# Patient Record
Sex: Male | Born: 1985 | Race: White | Hispanic: No | Marital: Single | Smoking: Current every day smoker
Health system: Southern US, Community
[De-identification: ages and names within clinical notes are randomized; demographics above are authoritative.]

## PROBLEM LIST (undated history)

## (undated) DIAGNOSIS — T7840XA Allergy, unspecified, initial encounter: Secondary | ICD-10-CM

## (undated) DIAGNOSIS — B029 Zoster without complications: Secondary | ICD-10-CM

## (undated) DIAGNOSIS — I10 Essential (primary) hypertension: Secondary | ICD-10-CM

## (undated) DIAGNOSIS — M549 Dorsalgia, unspecified: Secondary | ICD-10-CM

## (undated) HISTORY — PX: TONSILECTOMY/ADENOIDECTOMY WITH MYRINGOTOMY: SHX6125

## (undated) HISTORY — PX: TONSILLECTOMY: SUR1361

## (undated) HISTORY — DX: Allergy, unspecified, initial encounter: T78.40XA

---
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
## 2008-07-04 ENCOUNTER — Emergency Department (HOSPITAL_COMMUNITY): Admission: EM | Admit: 2008-07-04 | Discharge: 2008-07-04 | Payer: Self-pay | Admitting: Emergency Medicine

## 2009-06-03 ENCOUNTER — Emergency Department (HOSPITAL_COMMUNITY): Admission: EM | Admit: 2009-06-03 | Discharge: 2009-06-03 | Payer: Self-pay | Admitting: Emergency Medicine

## 2011-03-22 LAB — DIFFERENTIAL
Basophils Absolute: 0.2 10*3/uL — ABNORMAL HIGH (ref 0.0–0.1)
Basophils Relative: 2 % — ABNORMAL HIGH (ref 0–1)
Eosinophils Absolute: 0.4 10*3/uL (ref 0.0–0.7)
Eosinophils Relative: 5 % (ref 0–5)
Lymphocytes Relative: 31 % (ref 12–46)
Lymphs Abs: 2.5 10*3/uL (ref 0.7–4.0)
Monocytes Absolute: 0.8 10*3/uL (ref 0.1–1.0)
Monocytes Relative: 10 % (ref 3–12)
Neutro Abs: 4.2 10*3/uL (ref 1.7–7.7)
Neutrophils Relative %: 53 % (ref 43–77)

## 2011-03-22 LAB — POCT I-STAT, CHEM 8
BUN: 19 mg/dL (ref 6–23)
Calcium, Ion: 1.2 mmol/L (ref 1.12–1.32)
Chloride: 104 mEq/L (ref 96–112)
Creatinine, Ser: 1 mg/dL (ref 0.4–1.5)
Glucose, Bld: 90 mg/dL (ref 70–99)
HCT: 50 % (ref 39.0–52.0)
Hemoglobin: 17 g/dL (ref 13.0–17.0)
Potassium: 4.4 mEq/L (ref 3.5–5.1)
Sodium: 139 mEq/L (ref 135–145)
TCO2: 28 mmol/L (ref 0–100)

## 2011-03-22 LAB — URINALYSIS, ROUTINE W REFLEX MICROSCOPIC
Bilirubin Urine: NEGATIVE
Glucose, UA: NEGATIVE mg/dL
Hgb urine dipstick: NEGATIVE
Ketones, ur: NEGATIVE mg/dL
Nitrite: NEGATIVE
Protein, ur: NEGATIVE mg/dL
Specific Gravity, Urine: 1.023 (ref 1.005–1.030)
Urobilinogen, UA: 1 mg/dL (ref 0.0–1.0)
pH: 6.5 (ref 5.0–8.0)

## 2011-03-22 LAB — CBC
HCT: 48.9 % (ref 39.0–52.0)
Hemoglobin: 16.6 g/dL (ref 13.0–17.0)
MCHC: 34 g/dL (ref 30.0–36.0)
MCV: 91.7 fL (ref 78.0–100.0)
Platelets: 210 10*3/uL (ref 150–400)
RBC: 5.33 MIL/uL (ref 4.22–5.81)
RDW: 12.7 % (ref 11.5–15.5)
WBC: 8.1 10*3/uL (ref 4.0–10.5)

## 2011-03-22 LAB — POCT CARDIAC MARKERS
CKMB, poc: <1 ng/mL — ABNORMAL LOW (ref 1.0–8.0)
Troponin i, poc: <0.05 ng/mL (ref 0.00–0.09)

## 2013-04-20 ENCOUNTER — Ambulatory Visit: Payer: BC Managed Care – PPO

## 2013-04-20 ENCOUNTER — Ambulatory Visit (INDEPENDENT_AMBULATORY_CARE_PROVIDER_SITE_OTHER): Payer: BC Managed Care – PPO | Admitting: Emergency Medicine

## 2013-04-20 VITALS — BP 120/70 | HR 84 | Temp 98.8°F | Resp 16 | Ht 72.0 in | Wt 255.0 lb

## 2013-04-20 DIAGNOSIS — R109 Unspecified abdominal pain: Secondary | ICD-10-CM

## 2013-04-20 LAB — POCT URINALYSIS DIPSTICK
Protein, UA: NEGATIVE
Spec Grav, UA: 1.015
Urobilinogen, UA: 0.2

## 2013-04-20 LAB — POCT CBC
Granulocyte percent: 64.3 %G (ref 37–80)
MCV: 93.7 fL (ref 80–97)
MID (cbc): 0.7 (ref 0–0.9)
POC Granulocyte: 5.2 (ref 2–6.9)
POC LYMPH PERCENT: 27.6 %L (ref 10–50)
Platelet Count, POC: 264 10*3/uL (ref 142–424)
RDW, POC: 12.6 %

## 2013-04-20 LAB — POCT UA - MICROSCOPIC ONLY
Casts, Ur, LPF, POC: NEGATIVE
Yeast, UA: NEGATIVE

## 2013-04-20 MED ORDER — POLYETHYLENE GLYCOL 3350 17 GM/SCOOP PO POWD: 17.0000 g | Freq: Two times a day (BID) | ORAL | Status: DC | PRN

## 2013-04-20 NOTE — Progress Notes (Signed)
[facility] [address] [place] [address] [phone]  Date:  04/20/2013   Name:  Spencer Salas   DOB:  September 03, 1986   MRN:  [id number]  PCP:  No PCP Per Patient    Chief Complaint: Flank Pain   History of Present Illness:  Spencer Salas is a 27 y.o. very pleasant male patient who presents with the following:  Vague pain in both CVA that radiate around the sides the the flanks.  No dysuria, urgency or frequency.  No nausea or vomiting. No stool change.  No food intolerance.  Excess soft drink consumption.  Stopped smoking (2 PPD) in December.  No fever or chills, some decreased appetite.  No excess alcohol.  No injury or overuse, no antecedent illness.  No cough or coryza.  No fever or chills.  Pain present for past week. No improvement with over the counter medications or other home remedies. Denies other complaint or health concern today.   There are no active problems to display for this patient.   Past Medical History  Diagnosis Date  . Allergy     Past Surgical History  Procedure Laterality Date  . Tonsilectomy/adenoidectomy with myringotomy      History  Substance Use Topics  . Smoking status: Former Smoker    Types: Cigarettes    Quit date: 12/04/2012  . Smokeless tobacco: Not on file  . Alcohol Use: Yes     Comment: 2/month    Family History  Problem Relation Age of Onset  . Hypertension Father   . Stroke Maternal Grandfather     No Known Allergies  Medication list has been reviewed and updated.  No current outpatient prescriptions on file prior to visit.   No current facility-administered medications on file prior to visit.    Review of Systems:  As per HPI, otherwise negative.    Physical Examination: Filed Vitals:   04/20/13 1127  BP: 120/70  Pulse: 84  Temp: 98.8 F (37.1 C)  Resp: 16   Filed Vitals:   04/20/13 1127  Height: 6' (1.829 m)  Weight: 255 lb (115.667 kg)   Body mass index is 34.58 kg/(m^2). Ideal  Body Weight: Weight in (lb) to have BMI = 25: 183.9  GEN: WDWN, NAD, Non-toxic, A & O x 3 HEENT: Atraumatic, Normocephalic. Neck supple. No masses, No LAD. Ears and Nose: No external deformity. CV: RRR, No M/G/R. No JVD. No thrill. No extra heart sounds. PULM: CTA B, no wheezes, crackles, rhonchi. No retractions. No resp. distress. No accessory muscle use. ABD: S, NT, ND, +BS. No rebound. No HSM. EXTR: No c/c/e NEURO Normal gait.  PSYCH: Normally interactive. Conversant. Not depressed or anxious appearing.  Calm demeanor.    Assessment and Plan: Abdominal pain miralax   Signed,  Phillips Odor, MD   Results for orders placed in visit on 04/20/13  POCT CBC      Result Value Range   WBC 8.1  4.6 - 10.2 K/uL   Lymph, poc 2.2  0.6 - 3.4   POC LYMPH PERCENT 27.6  10 - 50 %L   MID (cbc) 0.7  0 - 0.9   POC MID % 8.1  0 - 12 %M   POC Granulocyte 5.2  2 - 6.9   Granulocyte percent 64.3  37 - 80 %G   RBC 5.16  4.69 - 6.13 M/uL   Hemoglobin 16.0  14.1 - 18.1 g/dL   HCT, POC 29.5  62.1 -  53.7 %   MCV 93.7  80 - 97 fL   MCH, POC 31.0  27 - 31.2 pg   MCHC 33.1  31.8 - 35.4 g/dL   RDW, POC 96.0     Platelet Count, POC 264  142 - 424 K/uL   MPV 9.1  0 - 99.8 fL  POCT URINALYSIS DIPSTICK      Result Value Range   Color, UA yellow     Clarity, UA clear     Glucose, UA neg     Bilirubin, UA neg     Ketones, UA neg     Spec Grav, UA 1.015     Blood, UA neg     pH, UA 7.0     Protein, UA neg     Urobilinogen, UA 0.2     Nitrite, UA neg     Leukocytes, UA Negative    POCT UA - MICROSCOPIC ONLY      Result Value Range   WBC, Ur, HPF, POC 0-1     RBC, urine, microscopic neg     Bacteria, U Microscopic neg     Mucus, UA neg     Epithelial cells, urine per micros 0-1     Crystals, Ur, HPF, POC neg     Casts, Ur, LPF, POC neg     Yeast, UA neg     [facility] reading (PRIMARY) by  Dr. Dareen Piano.  No SBO, free air.

## 2013-04-22 DIAGNOSIS — R110 Nausea: Secondary | ICD-10-CM | POA: Insufficient documentation

## 2013-04-22 DIAGNOSIS — Z79899 Other long term (current) drug therapy: Secondary | ICD-10-CM | POA: Insufficient documentation

## 2013-04-22 DIAGNOSIS — R109 Unspecified abdominal pain: Secondary | ICD-10-CM | POA: Insufficient documentation

## 2013-04-22 DIAGNOSIS — K5900 Constipation, unspecified: Secondary | ICD-10-CM | POA: Insufficient documentation

## 2013-04-22 DIAGNOSIS — Z87891 Personal history of nicotine dependence: Secondary | ICD-10-CM | POA: Insufficient documentation

## 2013-04-22 LAB — COMPREHENSIVE METABOLIC PANEL
ALT: 34 U/L (ref 0–53)
AST: 25 U/L (ref 0–37)
Albumin: 4.1 g/dL (ref 3.5–5.2)
Alkaline Phosphatase: 46 U/L (ref 39–117)
CO2: 29 mEq/L (ref 19–32)
Chloride: 101 mEq/L (ref 96–112)
GFR calc non Af Amer: 79 mL/min — ABNORMAL LOW (ref >90)
Potassium: 3.9 mEq/L (ref 3.5–5.1)
Sodium: 138 mEq/L (ref 135–145)
Total Bilirubin: 0.4 mg/dL (ref 0.3–1.2)

## 2013-04-22 LAB — CBC WITH DIFFERENTIAL/PLATELET
Basophils Absolute: 0.1 10*3/uL (ref 0.0–0.1)
HCT: 42.9 % (ref 39.0–52.0)
Lymphocytes Relative: 35 % (ref 12–46)
Monocytes Absolute: 0.8 10*3/uL (ref 0.1–1.0)
Neutro Abs: 4.9 10*3/uL (ref 1.7–7.7)
Neutrophils Relative %: 52 % (ref 43–77)
Platelets: 230 10*3/uL (ref 150–400)
RDW: 12.4 % (ref 11.5–15.5)
WBC: 9.5 10*3/uL (ref 4.0–10.5)

## 2013-04-22 NOTE — ED Notes (Addendum)
Pt c/o sharp pain in the upper left quadrant. Pt states he was diagnosed with constipation and given a script for miralax. LBM 04/22/2013. Per pt he was having no trouble with BM before diagnosis.

## 2013-04-23 ENCOUNTER — Emergency Department (HOSPITAL_COMMUNITY)
Admission: EM | Admit: 2013-04-23 | Discharge: 2013-04-23 | Disposition: A | Discharge status: Home / Self Care | Payer: BC Managed Care – PPO | Attending: Emergency Medicine | Admitting: Emergency Medicine

## 2013-04-23 ENCOUNTER — Emergency Department (HOSPITAL_COMMUNITY): Payer: BC Managed Care – PPO

## 2013-04-23 DIAGNOSIS — R109 Unspecified abdominal pain: Secondary | ICD-10-CM

## 2013-04-23 MED ORDER — IBUPROFEN 800 MG PO TABS: 800.0000 mg | ORAL_TABLET | Freq: Three times a day (TID) | ORAL | Status: DC

## 2013-04-23 MED ORDER — IOHEXOL 300 MG/ML  SOLN
50.0000 mL | Freq: Once | INTRAMUSCULAR | Status: AC | PRN
  Administered 2013-04-23: 50 mL via ORAL

## 2013-04-23 MED ORDER — ONDANSETRON HCL 4 MG PO TABS: 4.0000 mg | ORAL_TABLET | Freq: Four times a day (QID) | ORAL | Status: DC

## 2013-04-23 MED ORDER — IOHEXOL 300 MG/ML  SOLN
100.0000 mL | Freq: Once | INTRAMUSCULAR | Status: AC | PRN
  Administered 2013-04-23: 100 mL via INTRAVENOUS

## 2013-04-23 NOTE — ED Provider Notes (Signed)
History     CSN: [id number]  Arrival date & time 04/22/13  2241   First MD Initiated Contact with Patient 04/23/13 0117      Chief Complaint  Patient presents with  . Abdominal Pain    (Consider location/radiation/quality/duration/timing/severity/associated sxs/prior treatment) HPI HX per PT - ABD pain x 3 days located bilateral lower quadrants pressure like and occassionaly gets some LUQ pains more sharp in quality. He was evaluated at [facility] 3 days ago for this and told that he had constipation.  Since that time taking miralax with some loose stools. No F/C. Has had some persistent nausea, no emesis. No blood in stools or urine. No h/o same. No sick contacts or recent travel. Pain mild to moderate, declines any pain medications at this time.   Past Medical History  Diagnosis Date  . Allergy     Past Surgical History  Procedure Laterality Date  . Tonsilectomy/adenoidectomy with myringotomy      Family History  Problem Relation Age of Onset  . Hypertension Father   . Stroke Maternal Grandfather     History  Substance Use Topics  . Smoking status: Former Smoker    Types: Cigarettes    Quit date: 12/04/2012  . Smokeless tobacco: Not on file  . Alcohol Use: Yes     Comment: 2/month      Review of Systems  Constitutional: Negative for fever and chills.  HENT: Negative for sore throat, neck pain and neck stiffness.   Eyes: Negative for pain.  Respiratory: Negative for shortness of breath.   Cardiovascular: Negative for chest pain.  Gastrointestinal: Positive for nausea and abdominal pain.  Genitourinary: Negative for hematuria and flank pain.  Musculoskeletal: Negative for back pain.  Skin: Negative for rash.  Neurological: Negative for headaches.  All other systems reviewed and are negative.    Allergies  Review of patient's allergies indicates no known allergies.  Home Medications   Current Outpatient Rx  Name  Route  Sig  Dispense  Refill   . cetirizine (ZYRTEC) 10 MG tablet   Oral   Take 10 mg by mouth daily.         Marland Kitchen esomeprazole (NEXIUM) 20 MG capsule   Oral   Take 20 mg by mouth as needed.         . polyethylene glycol powder (GLYCOLAX/MIRALAX) powder   Oral   Take 17 g by mouth 2 (two) times daily as needed.   3350 g   1     BP 138/92  Pulse 81  Temp(Src) 99 F (37.2 C) (Oral)  SpO2 99%  Physical Exam  Constitutional: He is oriented to person, place, and time. He appears well-developed and well-nourished.  HENT:  Head: Normocephalic and atraumatic.  Eyes: EOM are normal. Pupils are equal, round, and reactive to light.  Neck: Neck supple.  Cardiovascular: Regular rhythm and intact distal pulses.   Pulmonary/Chest: Effort normal. No respiratory distress.  Abdominal: Soft. Bowel sounds are normal. He exhibits no distension and no mass. There is no rebound and no guarding.  Mild diffuse tenderness, no acute ABD  Musculoskeletal: Normal range of motion. He exhibits no edema.  Neurological: He is alert and oriented to person, place, and time.  Skin: Skin is warm and dry.    ED Course  Procedures (including critical care time)  Results for orders placed during the hospital encounter of 04/23/13  CBC WITH DIFFERENTIAL      Result Value Range   WBC  9.5  4.0 - 10.5 K/uL   RBC 4.96  4.22 - 5.81 MIL/uL   Hemoglobin 15.5  13.0 - 17.0 g/dL   HCT 29.5  62.1 - 30.8 %   MCV 86.5  78.0 - 100.0 fL   MCH 31.3  26.0 - 34.0 pg   MCHC 36.1 (*) 30.0 - 36.0 g/dL   RDW 65.7  84.6 - 96.2 %   Platelets 230  150 - 400 K/uL   Neutrophils Relative 52  43 - 77 %   Neutro Abs 4.9  1.7 - 7.7 K/uL   Lymphocytes Relative 35  12 - 46 %   Lymphs Abs 3.3  0.7 - 4.0 K/uL   Monocytes Relative 9  3 - 12 %   Monocytes Absolute 0.8  0.1 - 1.0 K/uL   Eosinophils Relative 5  0 - 5 %   Eosinophils Absolute 0.4  0.0 - 0.7 K/uL   Basophils Relative 1  0 - 1 %   Basophils Absolute 0.1  0.0 - 0.1 K/uL  COMPREHENSIVE METABOLIC  PANEL      Result Value Range   Sodium 138  135 - 145 mEq/L   Potassium 3.9  3.5 - 5.1 mEq/L   Chloride 101  96 - 112 mEq/L   CO2 29  19 - 32 mEq/L   Glucose, Bld 88  70 - 99 mg/dL   BUN 16  6 - 23 mg/dL   Creatinine, Ser 9.52  0.50 - 1.35 mg/dL   Calcium 9.8  8.4 - 84.1 mg/dL   Total Protein 7.4  6.0 - 8.3 g/dL   Albumin 4.1  3.5 - 5.2 g/dL   AST 25  0 - 37 U/L   ALT 34  0 - 53 U/L   Alkaline Phosphatase 46  39 - 117 U/L   Total Bilirubin 0.4  0.3 - 1.2 mg/dL   GFR calc non Af Amer 79 (*) >90 mL/min   GFR calc Af Amer >90  >90 mL/min  LIPASE, BLOOD      Result Value Range   Lipase 19  11 - 59 U/L   Ct Abdomen Pelvis W Contrast  04/23/2013  *RADIOLOGY REPORT*  Clinical Data: Abdominal pain  CT ABDOMEN AND PELVIS WITH CONTRAST  Technique:  Multidetector CT imaging of the abdomen and pelvis was performed following the standard protocol during bolus administration of intravenous contrast.  Contrast: OMNIPAQUE IOHEXOL 300 MG/ML  SOLN  Comparison: None.  Findings: Limited images through the lung bases demonstrate no significant appreciable abnormality. The heart size is within normal limits. No pleural or pericardial effusion.  Low attenuation of the liver suggests fatty infiltration. Unremarkable biliary system, spleen, pancreas, adrenal glands. Nonobstructing left renal stones.  No hydronephrosis or hydroureter.  No CT evidence for colitis.  Normal appendix.  Small bowel loops are normal course and caliber.  No free intraperitoneal air or fluid.  No lymphadenopathy.  Normal caliber aorta and branch vessels.  Decompressed bladder.  Tiny fat containing left inguinal hernia.  IMPRESSION: No acute abdominopelvic process identified by CT.  Nonobstructing left renal stones.  Hepatic steatosis.   Original Report Authenticated By: Jearld Lesch, M.D.    5:08 AM recheck - pain free. No acute ABD. Plan Rx and outpatient referral provided. Ct results shared with PT. ABD pain precautions  provided   MDM  ABD pain  Labs/ CT scan  PT declines any medications in the ER  VS and nursing notes reviewed/ considered  Sunnie Nielsen, MD 04/23/13 [phone]

## 2013-04-23 NOTE — ED Notes (Signed)
CT notified pt done with contrast. 

## 2013-05-08 ENCOUNTER — Other Ambulatory Visit: Payer: Self-pay | Admitting: Nurse Practitioner

## 2013-05-08 DIAGNOSIS — R1011 Right upper quadrant pain: Secondary | ICD-10-CM

## 2013-05-08 DIAGNOSIS — M542 Cervicalgia: Secondary | ICD-10-CM

## 2013-05-11 ENCOUNTER — Ambulatory Visit
Admission: RE | Admit: 2013-05-11 | Discharge: 2013-05-11 | Disposition: A | Discharge status: Home / Self Care | Payer: BC Managed Care – PPO | Source: Ambulatory Visit | Attending: Nurse Practitioner | Admitting: Nurse Practitioner

## 2013-05-11 DIAGNOSIS — R1011 Right upper quadrant pain: Secondary | ICD-10-CM

## 2013-05-11 DIAGNOSIS — M542 Cervicalgia: Secondary | ICD-10-CM

## 2013-05-17 ENCOUNTER — Ambulatory Visit (INDEPENDENT_AMBULATORY_CARE_PROVIDER_SITE_OTHER): Payer: BC Managed Care – PPO | Admitting: Emergency Medicine

## 2013-05-17 VITALS — BP 118/90 | HR 77 | Temp 98.0°F | Resp 16 | Ht 70.0 in | Wt 245.0 lb

## 2013-05-17 DIAGNOSIS — S335XXA Sprain of ligaments of lumbar spine, initial encounter: Secondary | ICD-10-CM

## 2013-05-17 DIAGNOSIS — G5680 Other specified mononeuropathies of unspecified upper limb: Secondary | ICD-10-CM

## 2013-05-17 DIAGNOSIS — G5681 Other specified mononeuropathies of right upper limb: Secondary | ICD-10-CM

## 2013-05-17 MED ORDER — TRAMADOL HCL 50 MG PO TABS: 50.0000 mg | ORAL_TABLET | Freq: Three times a day (TID) | ORAL | Status: DC | PRN

## 2013-05-17 MED ORDER — MELOXICAM 15 MG PO TABS: 15.0000 mg | ORAL_TABLET | Freq: Every day | ORAL | Status: DC

## 2013-05-17 MED ORDER — CYCLOBENZAPRINE HCL 10 MG PO TABS: 10.0000 mg | ORAL_TABLET | Freq: Three times a day (TID) | ORAL | Status: DC | PRN

## 2013-05-17 NOTE — Progress Notes (Signed)
[facility] [address], [place] [address] [phone]  Date:  05/17/2013   Name:  Spencer Salas   DOB:  [birth year]   MRN:  [id number]  PCP:  No PCP Per Patient    Chief Complaint: Back Pain and Abdominal Pain   History of Present Illness:  Spencer Salas is a 27 y.o. very pleasant male patient who presents with the following:  Seen here, in ER, by GI and NP at work for evaluation of neck, back and abdominal pain.  So far his work up has been conflicting.  His understanding is that he has a fatty liver, inguinal hernia and cervical spinal stenosis causing his pain in the back and neck and abdomen.  He has no numbness, tingling or weakness.  Says that he frequently has radicular pain in both arms.  No nausea or vomiting. No stool change.  He was apparently referred to ortho for evaluation of his spinal stenosis.  He is unable to work due to pain in his neck and across his back at the level of the inferior scapula and in lumbar region.  No on medication.  No improvement with over the counter medications or other home remedies. Denies other complaint or health concern today.   There are no active problems to display for this patient.   Past Medical History  Diagnosis Date  . Allergy     Past Surgical History  Procedure Laterality Date  . Tonsilectomy/adenoidectomy with myringotomy      History  Substance Use Topics  . Smoking status: Current Every Day Smoker    Types: Cigarettes    Last Attempt to Quit: 12/04/2012  . Smokeless tobacco: Not on file     Comment: per patient's health survey form - other (vapor)  . Alcohol Use: Yes     Comment: 2/month    Family History  Problem Relation Age of Onset  . Hypertension Father   . Stroke Maternal Grandfather   . Gallstones Mother   . Heart disease Sister     heart mumur    No Known Allergies  Medication list has been reviewed and updated.  Current Outpatient Prescriptions on File Prior to Visit   Medication Sig Dispense Refill  . cetirizine (ZYRTEC) 10 MG tablet Take 10 mg by mouth daily as needed for allergies.       Marland Kitchen esomeprazole (NEXIUM) 20 MG capsule Take 20 mg by mouth daily as needed (for acid reflu).       Marland Kitchen ibuprofen (ADVIL,MOTRIN) 200 MG tablet Take 800 mg by mouth every 6 (six) hours as needed for pain.      Marland Kitchen ibuprofen (ADVIL,MOTRIN) 800 MG tablet Take 1 tablet (800 mg total) by mouth 3 (three) times daily.  21 tablet  0  . ondansetron (ZOFRAN) 4 MG tablet Take 1 tablet (4 mg total) by mouth every 6 (six) hours.  12 tablet  0  . polyethylene glycol powder (GLYCOLAX/MIRALAX) powder Take 17 g by mouth 2 (two) times daily as needed (for constipation).       No current facility-administered medications on file prior to visit.    Review of Systems:  As per HPI, otherwise negative.    Physical Examination: Filed Vitals:   05/17/13 1119  BP: 118/90  Pulse: 77  Temp: 98 F (36.7 C)  Resp: 16   Filed Vitals:   05/17/13 1119  Height: 5\' 10"  (1.778 m)  Weight: 245 lb (111.131 kg)   Body  mass index is 35.15 kg/(m^2). Ideal Body Weight: Weight in (lb) to have BMI = 25: 173.9  GEN: WDWN, NAD, Non-toxic, A & O x 3 HEENT: Atraumatic, Normocephalic. Neck supple. No masses, No LAD. Ears and Nose: No external deformity. CV: RRR, No M/G/R. No JVD. No thrill. No extra heart sounds. PULM: CTA B, no wheezes, crackles, rhonchi. No retractions. No resp. distress. No accessory muscle use. ABD: S, NT, ND, +BS. No rebound. No HSM. EXTR: No c/c/e NEURO Normal gait.  PSYCH: Normally interactive. Conversant. Not depressed or anxious appearing.  Calm demeanor.  BACK:  Tender left upper back medial to scapula and right lumbar paraspinous muscles.  Neuro intact  Assessment and Plan: Back pain Awaiting ortho consult mobic Flexeril Tramadol   Signed,  Phillips Odor, MD

## 2013-07-26 ENCOUNTER — Ambulatory Visit: Payer: BC Managed Care – PPO

## 2013-07-26 ENCOUNTER — Ambulatory Visit (INDEPENDENT_AMBULATORY_CARE_PROVIDER_SITE_OTHER): Payer: BC Managed Care – PPO | Admitting: Family Medicine

## 2013-07-26 VITALS — BP 128/78 | HR 79 | Temp 97.7°F | Resp 18 | Ht 72.0 in | Wt 255.0 lb

## 2013-07-26 DIAGNOSIS — T148XXA Other injury of unspecified body region, initial encounter: Secondary | ICD-10-CM

## 2013-07-26 DIAGNOSIS — M25562 Pain in left knee: Secondary | ICD-10-CM

## 2013-07-26 DIAGNOSIS — M25569 Pain in unspecified knee: Secondary | ICD-10-CM

## 2013-07-26 MED ORDER — TIZANIDINE HCL 4 MG PO TABS: 4.0000 mg | ORAL_TABLET | Freq: Two times a day (BID) | ORAL | Status: DC | PRN

## 2013-07-26 NOTE — Progress Notes (Signed)
[facility]:  Office Visit  Chief Complaint:  Chief Complaint  Patient presents with  . Knee Pain    left knee; since lastnight     HPI: Spencer Salas is a 27 y.o. male who complains of  Left knee pain  He works at [employer] and started having knee pain out of the blue last night. He wears his sneakers with orthotics to work but they are kind of hard. He has had a knee injury, age  56 y/o  In a wrestling competition, he got thrown in the air and landed on his knee. He had a medial ligament injury and had to be in a knee immobilizer for several weeks. Has  Tried  RICE, NKI, + crunching noise , denies instability, numbness, weakness, or tingling  Past Medical History  Diagnosis Date  . Allergy    Past Surgical History  Procedure Laterality Date  . Tonsilectomy/adenoidectomy with myringotomy     History   Social History  . Marital Status: Single    Spouse Name: N/A    Number of Children: N/A  . Years of Education: N/A   Social History Main Topics  . Smoking status: Current Every Day Smoker    Types: Cigarettes    Last Attempt to Quit: 12/04/2012  . Smokeless tobacco: None     Comment: per patient's health survey form - other (vapor)  . Alcohol Use: Yes     Comment: 2/month  . Drug Use: No  . Sexual Activity: Yes     Comment: number of sex partners in the last 12 months 1 - Kullen Tomasetti   Other Topics Concern  . None   Social History Narrative  . None   Family History  Problem Relation Age of Onset  . Hypertension Father   . Stroke Maternal Grandfather   . Gallstones Mother   . Heart disease Sister     heart mumur   No Known Allergies Prior to Admission medications   Medication Sig Start Date End Date Taking? Authorizing Provider  cetirizine (ZYRTEC) 10 MG tablet Take 10 mg by mouth daily as needed for allergies.    Yes Historical Provider, MD  esomeprazole (NEXIUM) 20 MG capsule Take 20 mg by mouth daily as  needed (for acid reflu).    Yes Historical Provider, MD  tiZANidine (ZANAFLEX) 4 MG tablet Take 4 mg by mouth every 6 (six) hours as needed.   Yes Historical Provider, MD  cyclobenzaprine (FLEXERIL) 10 MG tablet Take 1 tablet (10 mg total) by mouth 3 (three) times daily as needed for muscle spasms. 05/17/13   Phillips Odor, MD  ibuprofen (ADVIL,MOTRIN) 200 MG tablet Take 800 mg by mouth every 6 (six) hours as needed for pain.    Historical Provider, MD  ibuprofen (ADVIL,MOTRIN) 800 MG tablet Take 1 tablet (800 mg total) by mouth 3 (three) times daily. 04/23/13   Sunnie Nielsen, MD  meloxicam (MOBIC) 15 MG tablet Take 1 tablet (15 mg total) by mouth daily. 05/17/13   Phillips Odor, MD  ondansetron (ZOFRAN) 4 MG tablet Take 1 tablet (4 mg total) by mouth every 6 (six) hours. 04/23/13   Sunnie Nielsen, MD  polyethylene glycol powder (GLYCOLAX/MIRALAX) powder Take 17 g by mouth 2 (two) times daily as needed (for constipation). 04/20/13   Phillips Odor, MD  traMADol (ULTRAM) 50 MG tablet Take 1 tablet (50 mg total) by mouth every 8 (eight) hours as needed for pain. 05/17/13  Phillips Odor, MD     ROS: The patient denies fevers, chills, night sweats, unintentional weight loss, chest pain, palpitations, wheezing, dyspnea on exertion, nausea, vomiting, abdominal pain, dysuria, hematuria, melena, numbness, weakness, or tingling.   All other systems have been reviewed and were otherwise negative with the exception of those mentioned in the HPI and as above.    PHYSICAL EXAM: Filed Vitals:   07/26/13 1327  BP: 128/78  Pulse: 79  Temp: 97.7 F (36.5 C)  Resp: 18   Filed Vitals:   07/26/13 1327  Height: 6' (1.829 m)  Weight: 255 lb (115.667 kg)   Body mass index is 34.58 kg/(m^2).  General: Alert, no acute distress HEENT:  Normocephalic, atraumatic, oropharynx patent.  Cardiovascular:  Regular rate and rhythm, no rubs murmurs or gallops.  No Carotid bruits, radial pulse intact. No pedal edema.   Respiratory: Clear to auscultation bilaterally.  No wheezes, rales, or rhonchi.  No cyanosis, no use of accessory musculature GI: No organomegaly, abdomen is soft and non-tender, positive bowel sounds.  No masses. Skin: No rashes. Neurologic: Facial musculature symmetric. Psychiatric: Patient is appropriate throughout our interaction. Lymphatic: No cervical lymphadenopathy Musculoskeletal: Gait antalgic due to pain + medial ligamentous tenderness, + pain with valgus stress + jt line tenderness, +/- McMurray 5/5 strength, 2/2 DTR No deformity, no swelling, no crepitus Neg Lachman Hips are nl   LABS:    EKG/XRAY:   Primary read interpreted by [facility] Conley Rolls at [facility]. Minimal DJD No fx/dislocations   ASSESSMENT/PLAN: Encounter Diagnoses  Name Primary?  . Knee pain, acute, left Yes  . Sprain and strain    Given knee brace Weight loss, change shoes C/w Zanaflex for pain If he does not have Ultrama or mobic at home then we can call it itn, #30, 1 RF only He declines anything stronger Gross sideeffects, risk and benefits, and alternatives of medications d/w patient. Patient is aware that all medications have potential sideeffects and we are unable to predict every sideeffect or drug-drug interaction that may occur. F/u prn   Tannie Koskela PHUONG, DO 07/26/2013 2:51 PM

## 2013-10-18 ENCOUNTER — Other Ambulatory Visit: Payer: Self-pay

## 2014-03-24 ENCOUNTER — Emergency Department: Payer: Self-pay | Admitting: Emergency Medicine

## 2014-03-24 LAB — COMPREHENSIVE METABOLIC PANEL
ALBUMIN: 3.7 g/dL (ref 3.4–5.0)
ALK PHOS: 54 U/L
ALT: 40 U/L (ref 12–78)
AST: 26 U/L (ref 15–37)
Anion Gap: 3 — ABNORMAL LOW (ref 7–16)
BILIRUBIN TOTAL: 0.2 mg/dL (ref 0.2–1.0)
BUN: 14 mg/dL (ref 7–18)
CALCIUM: 8.7 mg/dL (ref 8.5–10.1)
Chloride: 105 mmol/L (ref 98–107)
Co2: 31 mmol/L (ref 21–32)
Creatinine: 1.14 mg/dL (ref 0.60–1.30)
EGFR (African American): >60
EGFR (Non-African Amer.): >60
Glucose: 90 mg/dL (ref 65–99)
OSMOLALITY: 278 (ref 275–301)
Potassium: 3.5 mmol/L (ref 3.5–5.1)
SODIUM: 139 mmol/L (ref 136–145)
Total Protein: 7.6 g/dL (ref 6.4–8.2)

## 2014-03-24 LAB — CBC
HCT: 47.4 % (ref 40.0–52.0)
HGB: 15.9 g/dL (ref 13.0–18.0)
MCH: 29.7 pg (ref 26.0–34.0)
MCHC: 33.6 g/dL (ref 32.0–36.0)
MCV: 88 fL (ref 80–100)
Platelet: 247 10*3/uL (ref 150–440)
RBC: 5.36 10*6/uL (ref 4.40–5.90)
RDW: 13 % (ref 11.5–14.5)
WBC: 9.6 10*3/uL (ref 3.8–10.6)

## 2014-03-24 LAB — URINALYSIS, COMPLETE
BACTERIA: NONE SEEN
BILIRUBIN, UR: NEGATIVE
Glucose,UR: NEGATIVE mg/dL (ref 0–75)
Ketone: NEGATIVE
LEUKOCYTE ESTERASE: NEGATIVE
NITRITE: NEGATIVE
PH: 6 (ref 4.5–8.0)
Protein: NEGATIVE
SPECIFIC GRAVITY: 1.02 (ref 1.003–1.030)
Squamous Epithelial: NONE SEEN

## 2014-04-04 ENCOUNTER — Ambulatory Visit: Payer: Self-pay | Admitting: Urology

## 2014-06-05 ENCOUNTER — Telehealth: Payer: Self-pay

## 2014-06-05 NOTE — Telephone Encounter (Signed)
Returned patient's call. Left a message advising that we listened to his message and for him to please call us back.

## 2014-06-05 NOTE — Telephone Encounter (Signed)
Patient left voicemail on Tuesday at 5:41 pm saying he was seen in our office June 2014. He received a letter from his insurance company regarding a questionnaire and he needs to know why he was seen because he doesn't remember. Cb# [phone].

## 2014-06-07 NOTE — Telephone Encounter (Signed)
Left another message for patient to return call.

## 2014-09-21 IMAGING — CT CT ABD-PELV W/ CM
2 of 4 series · 17 of 46 positions shown, 19 images · IV contrast (APPLIED)
Comparison: None.

CLINICAL DATA: Abdominal pain

CT ABDOMEN AND PELVIS WITH CONTRAST
TECHNIQUE: Multidetector CT imaging of the abdomen and pelvis was
performed following the standard protocol during bolus
administration of intravenous contrast.
Contrast: 100mL OMNIPAQUE IOHEXOL 300 MG/ML  SOLN

[Series 2: abd/pelv with 5.0 b31f st · axial · 0.78mm/px · z∈[-465,-20]mm · 14 of 99 slices shown, 16 images]
[im 5/99  soft-tissue]
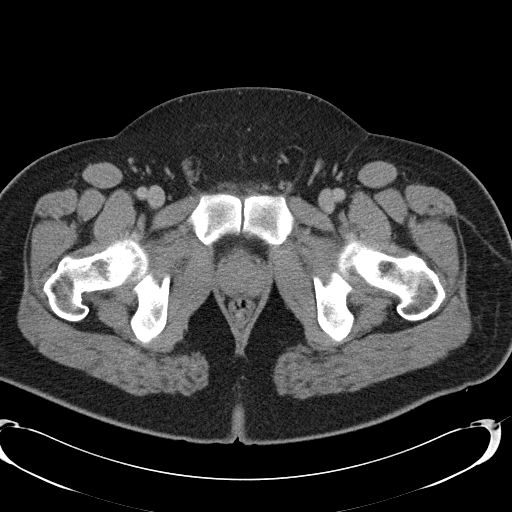
[im 5/99  bone]
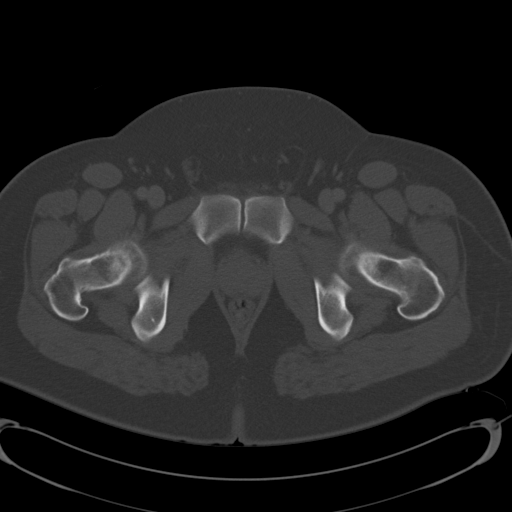
[im 14/99  soft-tissue]
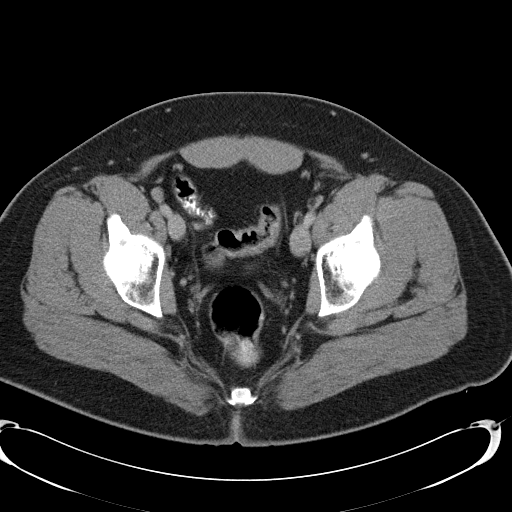
[im 18/99  soft-tissue]
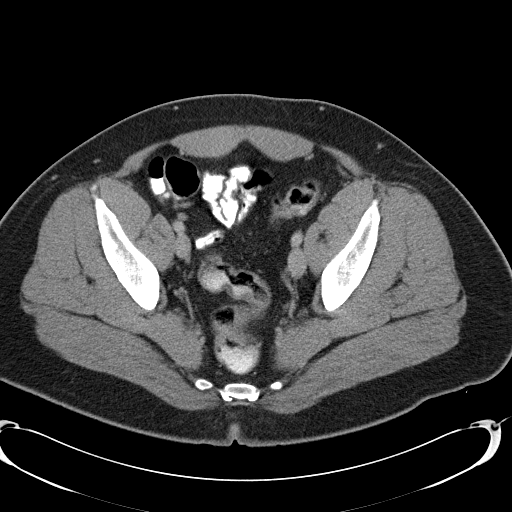
[im 27/99  soft-tissue]
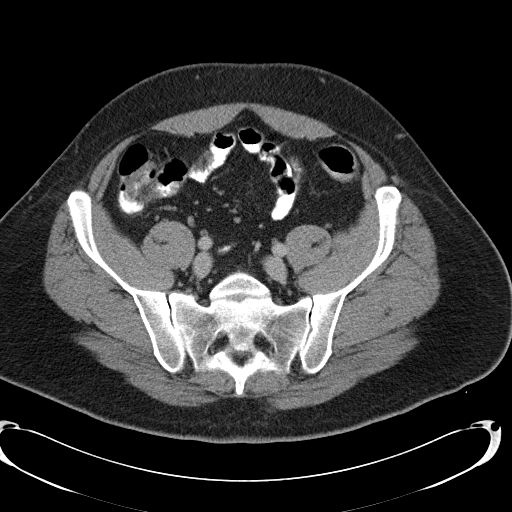
[im 32/99  soft-tissue]
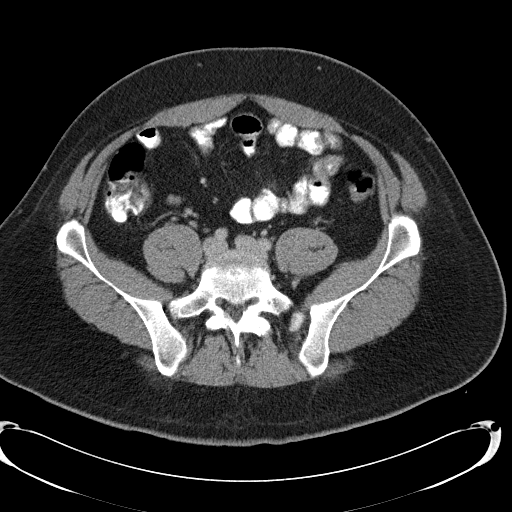
[im 41/99  soft-tissue]
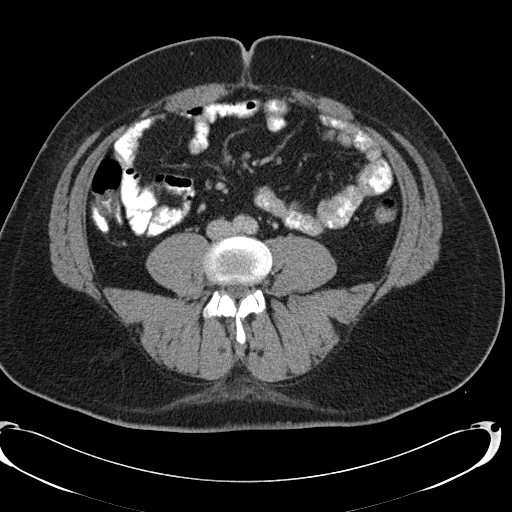
[im 45/99  soft-tissue]
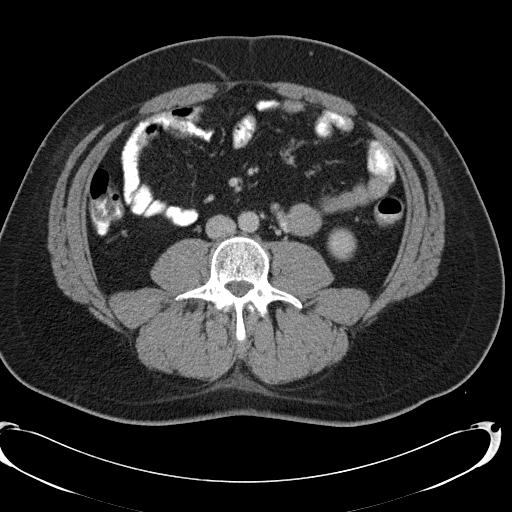
[im 54/99  soft-tissue]
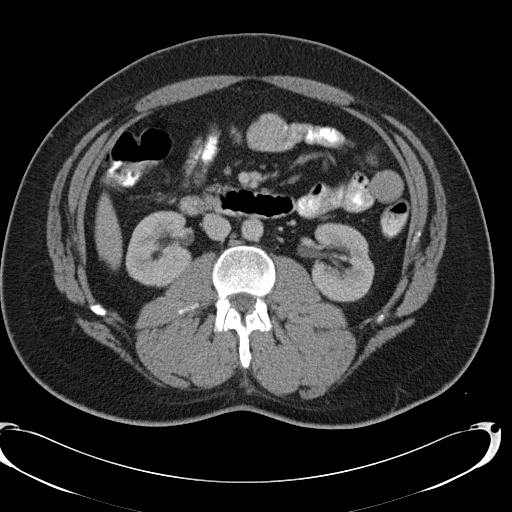
[im 58/99  soft-tissue]
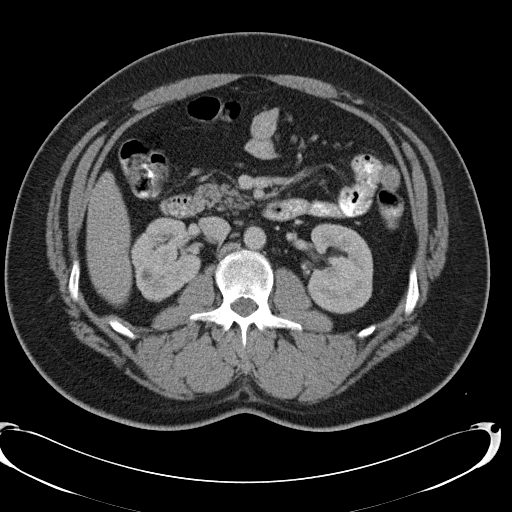
[im 58/99  bone]
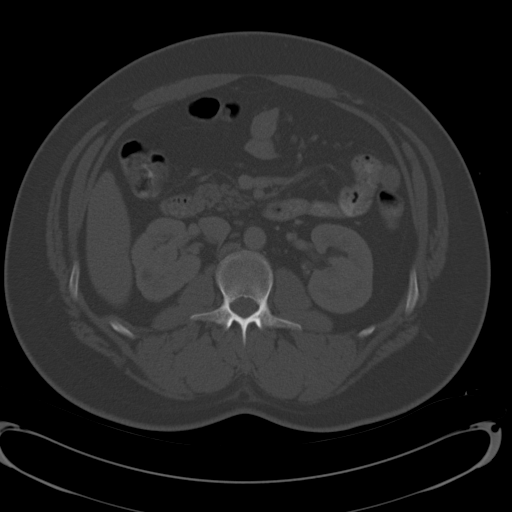
[im 67/99  soft-tissue]
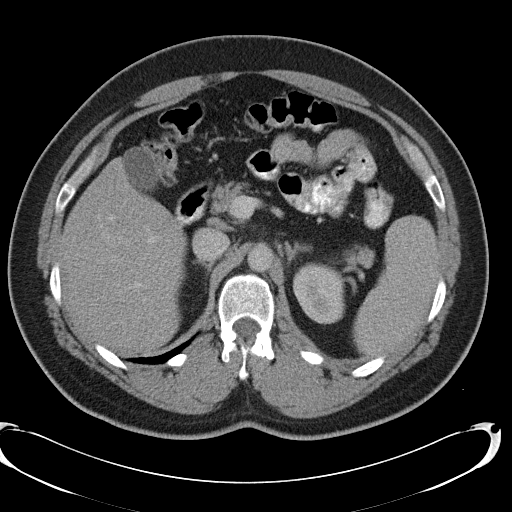
[im 72/99  soft-tissue]
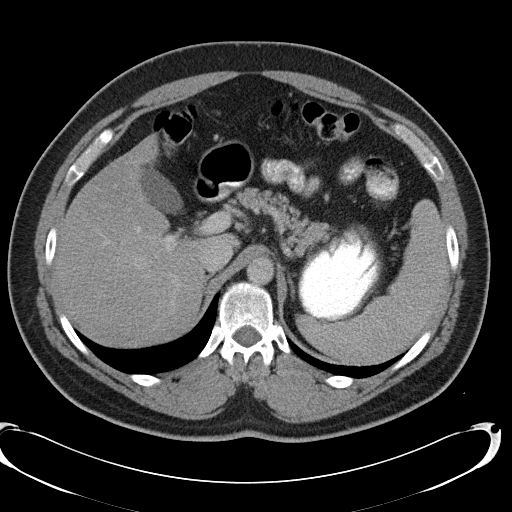
[im 81/99  soft-tissue]
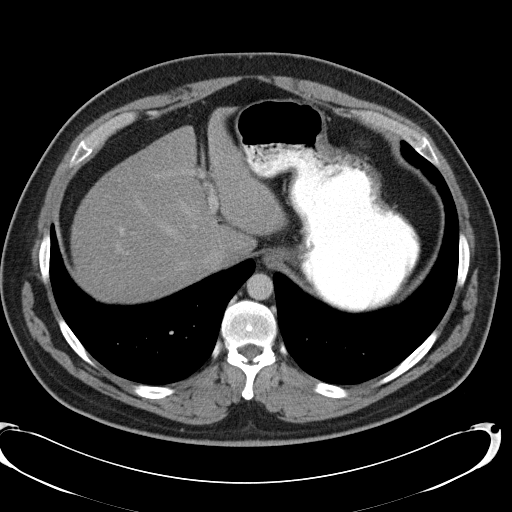
[im 85/99  soft-tissue]
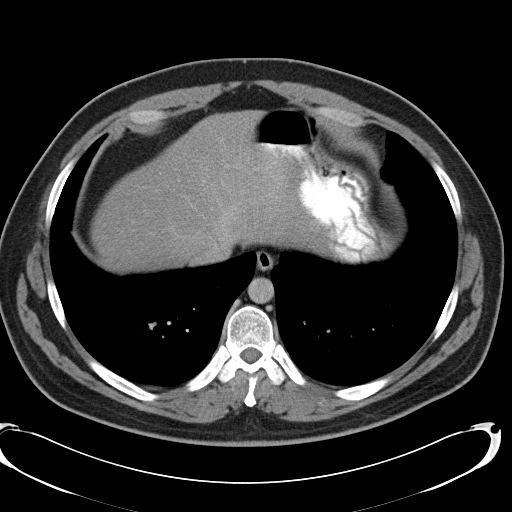
[im 94/99  soft-tissue]
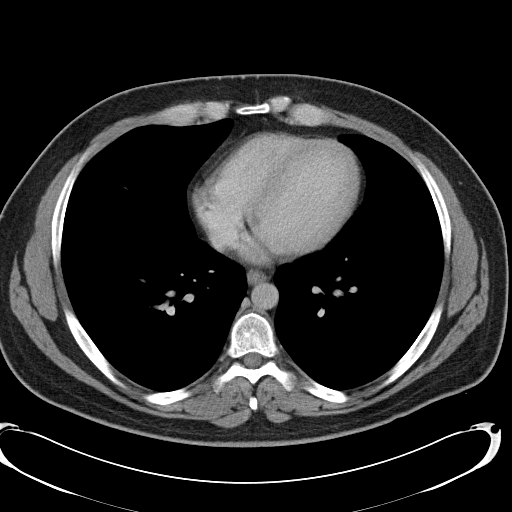

[Series 602: cor · coronal · 0.96mm/px · 3 of 148 slices shown]
[im 50/148  soft-tissue]
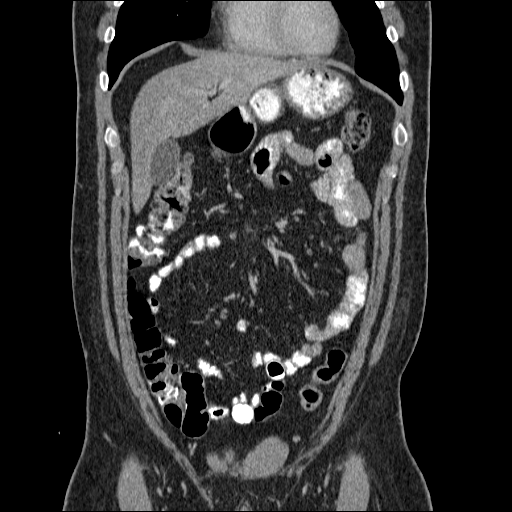
[im 66/148  soft-tissue]
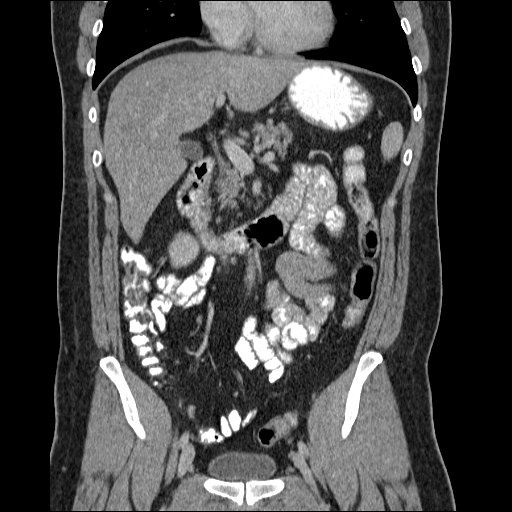
[im 82/148  soft-tissue]
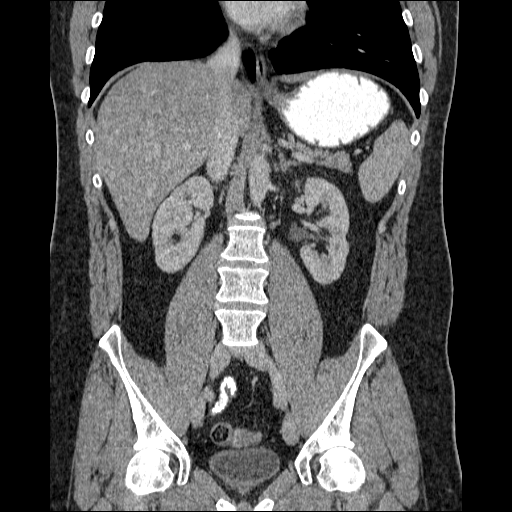

[17 of 46 positions shown; findings below may reference images not displayed]

FINDINGS: Limited images through the lung bases demonstrate no
significant appreciable abnormality. The heart size is within
normal limits. No pleural or pericardial effusion.

Low attenuation of the liver suggests fatty infiltration.
Unremarkable biliary system, spleen, pancreas, adrenal glands.
Nonobstructing left renal stones.  No hydronephrosis or
hydroureter.

No CT evidence for colitis.  Normal appendix.  Small bowel loops
are normal course and caliber.  No free intraperitoneal air or
fluid.  No lymphadenopathy.

Normal caliber aorta and branch vessels.

Decompressed bladder.  Tiny fat containing left inguinal hernia.
IMPRESSION: No acute abdominopelvic process identified by CT.

Nonobstructing left renal stones.

Hepatic steatosis.

## 2015-07-18 ENCOUNTER — Ambulatory Visit: Payer: Worker's Compensation

## 2015-07-18 ENCOUNTER — Ambulatory Visit (INDEPENDENT_AMBULATORY_CARE_PROVIDER_SITE_OTHER): Payer: Worker's Compensation | Admitting: Family Medicine

## 2015-07-18 VITALS — BP 138/90 | HR 54 | Temp 98.4°F | Resp 18 | Ht 68.25 in | Wt 258.6 lb

## 2015-07-18 DIAGNOSIS — S40022A Contusion of left upper arm, initial encounter: Secondary | ICD-10-CM | POA: Diagnosis not present

## 2015-07-18 DIAGNOSIS — S46912A Strain of unspecified muscle, fascia and tendon at shoulder and upper arm level, left arm, initial encounter: Secondary | ICD-10-CM

## 2015-07-18 DIAGNOSIS — M79622 Pain in left upper arm: Secondary | ICD-10-CM | POA: Diagnosis not present

## 2015-07-18 DIAGNOSIS — W010XXA Fall on same level from slipping, tripping and stumbling without subsequent striking against object, initial encounter: Secondary | ICD-10-CM | POA: Diagnosis not present

## 2015-07-18 NOTE — Patient Instructions (Signed)
Apply ice to the affected area several times daily  Limit use of the left arm to try and avoid straining further  Return Monday for a recheck  Return sooner at any time if you have additional concerns arise

## 2015-07-18 NOTE — Progress Notes (Signed)
  Subjective:  Patient ID: Spencer Salas, male    DOB: [birth year]  Age: 29 y.o. MRN: [id number]  Workers comp injury: Patient works at the [employer]. He got out of an SUV after pulling it into the garage. He put his right foot down and step out with his left foot which slid on wet surface and he fell hard directly on his left upper arm. He doesn't know if he hit anything like the running board when he was falling, but he does note that he hit the ground hard. It was concrete surface. He had immediate pain in his left arm from the elbow up. The elbow itself is calm down, but the pain persists above the left elbow about for 5 inches. He hurts some deep to his left scapular area. He does have a history of some back pain problems previously, but this was not hurting before his fall this morning. No other injuries. He is fully alert and oriented. This is a workers comp injury.    Objective:   Good range of motion of shoulder. Good range of motion. Tender in the left lower lateral triceps region. No ecchymosis. He does have a knot in the soft tissues there. The rest of the upper arm does not seem terribly tender, nor his shoulder. He does not have any real tenderness of the scapula but he hurts in that area.  [facility] reading (PRIMARY) by  Dr. Alwyn Ren. No bony injury noted    Assessment & Plan:   Assessment: Fall due to wet surface Left upper arm contusion and pain Left scapula strain  Plan: Patient Instructions  Apply ice to the affected area several times daily  Limit use of the left arm to try and avoid straining further  Return Monday for a recheck  Return sooner at any time if you have additional concerns arise     HOPPER,DAVID, MD 07/18/2015

## 2015-07-21 ENCOUNTER — Ambulatory Visit (INDEPENDENT_AMBULATORY_CARE_PROVIDER_SITE_OTHER): Payer: Worker's Compensation | Admitting: Family Medicine

## 2015-07-21 VITALS — BP 112/82 | HR 63 | Temp 98.2°F | Resp 16 | Ht 68.25 in | Wt 261.4 lb

## 2015-07-21 DIAGNOSIS — S40022D Contusion of left upper arm, subsequent encounter: Secondary | ICD-10-CM | POA: Diagnosis not present

## 2015-07-21 NOTE — Progress Notes (Signed)
  Subjective:  Patient ID: Spencer Salas, male    DOB: 1986-11-15  Age: 29 y.o. MRN: [id number]  Patient has improved. He was a little sore but symptoms have resolved. He was able to go to his job and do fine today. No complaints.   Objective:   Not is no longer present in the triceps area. The arm has good strength and good range of motion.  Assessment & Plan:   Assessment:  Contusion left arm, resolved  Plan: Return prn   Patient Instructions  Resume regular activities. No further treatments needed. Return if concerns.     Briahna Pescador, MD 07/21/2015

## 2015-07-21 NOTE — Patient Instructions (Signed)
Resume regular activities. No further treatments needed. Return if concerns.

## 2016-03-11 ENCOUNTER — Ambulatory Visit (INDEPENDENT_AMBULATORY_CARE_PROVIDER_SITE_OTHER): Payer: BLUE CROSS/BLUE SHIELD | Admitting: Physician Assistant

## 2016-03-11 VITALS — BP 130/80 | HR 86 | Temp 97.7°F | Resp 16 | Ht 71.0 in | Wt 269.0 lb

## 2016-03-11 DIAGNOSIS — F418 Other specified anxiety disorders: Secondary | ICD-10-CM

## 2016-03-11 DIAGNOSIS — F329 Major depressive disorder, single episode, unspecified: Secondary | ICD-10-CM

## 2016-03-11 DIAGNOSIS — R197 Diarrhea, unspecified: Secondary | ICD-10-CM | POA: Diagnosis not present

## 2016-03-11 DIAGNOSIS — F32A Depression, unspecified: Secondary | ICD-10-CM

## 2016-03-11 DIAGNOSIS — F419 Anxiety disorder, unspecified: Secondary | ICD-10-CM

## 2016-03-11 LAB — POCT CBC
GRANULOCYTE PERCENT: 52.7 % (ref 37–80)
HCT, POC: 47.6 % (ref 43.5–53.7)
HEMOGLOBIN: 17.1 g/dL (ref 14.1–18.1)
Lymph, poc: 1.7 (ref 0.6–3.4)
MCH, POC: 317 pg — AB (ref 27–31.2)
MCHC: 36 g/dL — AB (ref 31.8–35.4)
MCV: 88.1 fL (ref 80–97)
MID (cbc): 0.3 (ref 0–0.9)
MPV: 7.7 fL (ref 0–99.8)
PLATELET COUNT, POC: 203 10*3/uL (ref 142–424)
POC GRANULOCYTE: 2.3 (ref 2–6.9)
POC LYMPH %: 40.1 % (ref 10–50)
POC MID %: 7.2 %M (ref 0–12)
RBC: 5.4 M/uL (ref 4.69–6.13)
RDW, POC: 12.6 %
WBC: 4.3 10*3/uL — AB (ref 4.6–10.2)

## 2016-03-11 MED ORDER — ESCITALOPRAM OXALATE 10 MG PO TABS: ORAL_TABLET | ORAL | Status: DC

## 2016-03-11 NOTE — Patient Instructions (Addendum)
Please try to get 30 minutes of intentional physical activity every day.  This can include walking, cycling, elliptical.  Please come back into clinic in about 30 days so we can can talk again.     IF you received an x-ray today, you will receive an invoice from [facility]. Please contact [facility] at [phone] with questions or concerns regarding your invoice.   IF you received labwork today, you will receive an invoice from [facility]. Please contact [facility] at [phone] with questions or concerns regarding your invoice.   Our billing staff will not be able to assist you with questions regarding bills from these companies.  You will be contacted with the lab results as soon as they are available. The fastest way to get your results is to activate your My Chart account. Instructions are located on the last page of this paperwork. If you have not heard from us regarding the results in 2 weeks, please contact this office.
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
## 2016-04-27 ENCOUNTER — Other Ambulatory Visit: Payer: Self-pay | Admitting: Physician Assistant

## 2017-05-13 ENCOUNTER — Ambulatory Visit (INDEPENDENT_AMBULATORY_CARE_PROVIDER_SITE_OTHER): Payer: BLUE CROSS/BLUE SHIELD | Admitting: Physician Assistant

## 2017-05-13 ENCOUNTER — Encounter: Payer: Self-pay | Admitting: Physician Assistant

## 2017-05-13 VITALS — BP 148/94 | HR 72 | Temp 98.1°F | Resp 16 | Ht 71.2 in | Wt 255.0 lb

## 2017-05-13 DIAGNOSIS — I10 Essential (primary) hypertension: Secondary | ICD-10-CM | POA: Diagnosis not present

## 2017-05-13 DIAGNOSIS — G44221 Chronic tension-type headache, intractable: Secondary | ICD-10-CM | POA: Diagnosis not present

## 2017-05-13 DIAGNOSIS — M5412 Radiculopathy, cervical region: Secondary | ICD-10-CM | POA: Diagnosis not present

## 2017-05-13 LAB — POCT GLYCOSYLATED HEMOGLOBIN (HGB A1C): HEMOGLOBIN A1C: 5.1

## 2017-05-13 MED ORDER — PREDNISONE 50 MG PO TABS: ORAL_TABLET | ORAL | 0 refills | Status: DC

## 2017-05-13 MED ORDER — CYCLOBENZAPRINE HCL 10 MG PO TABS: 5.0000 mg | ORAL_TABLET | Freq: Three times a day (TID) | ORAL | 0 refills | Status: DC | PRN

## 2017-05-13 NOTE — Progress Notes (Signed)
05/14/2017 3:07 PM   DOB: 1986/09/08 / MRN: [id number]  SUBJECTIVE:  Spencer Salas is a 31 y.o. male presenting for daily HA that have been occurring for 3 months.  Tells me these stem from his neck.  Tells me this is a 6-7/10 but yesterday.  Pain is constant, bilateral, and changes with neck movement. He associates neck pain. Feels that he is getting worse.  He has tried Meloxicam, heating pad, Excedrin, Xanax.  Excedrin did dull the HA.  No teeth pain, ear pain, sinus pain.  He denies paresthesia, weakness, confusion, speech difficulty, and is able to perform all of his job duties without mental difficulty.   He has No Known Allergies.   He  has a past medical history of Allergy.    He  reports that he quit smoking about 4 years ago. His smoking use included Cigarettes. He has never used smokeless tobacco. He reports that he drinks alcohol. He reports that he does not use drugs. He  reports that he currently engages in sexual activity. The patient  has a past surgical history that includes Tonsilectomy/adenoidectomy with myringotomy.  His family history includes Gallstones in his mother; Heart disease in his sister; Hypertension in his father; Stroke in his maternal grandfather.  Review of Systems  Constitutional: Negative for chills and fever.  Respiratory: Negative for cough and shortness of breath.   Cardiovascular: Negative for orthopnea and leg swelling.  Gastrointestinal: Negative for nausea.  Skin: Negative for rash.  Neurological: Negative for dizziness.    The problem list and medications were reviewed and updated by myself where necessary and exist elsewhere in the encounter.   OBJECTIVE:  BP (!) 148/94   Pulse 72   Temp 98.1 F (36.7 C) (Oral)   Resp 16   Ht 5' 11.2" (1.808 m)   Wt 255 lb (115.7 kg)   SpO2 97%   BMI 35.37 kg/m   Physical Exam  Constitutional: He is oriented to person, place, and time. He appears well-developed. He is active and cooperative.  Non-toxic  appearance.  Eyes: EOM are normal. Pupils are equal, round, and reactive to light.  Cardiovascular: Normal rate.   Pulmonary/Chest: Effort normal. No tachypnea.  Neurological: He is alert and oriented to person, place, and time. He has normal strength and normal reflexes. He is not disoriented. He displays no atrophy, no tremor and normal reflexes. No cranial nerve deficit or sensory deficit. He exhibits normal muscle tone. He displays no seizure activity. Coordination and gait normal. GCS eye subscore is 4. GCS verbal subscore is 5. GCS motor subscore is 6.  Skin: Skin is warm and dry. He is not diaphoretic. No pallor.  Psychiatric: His behavior is normal.  Vitals reviewed.   Results for orders placed or performed in visit on 05/13/17 (from the past 72 hour(s))  POCT glycosylated hemoglobin (Hb A1C)     Status: None   Collection Time: 05/13/17 12:08 PM  Result Value Ref Range   Hemoglobin A1C 5.1   CBC     Status: None   Collection Time: 05/13/17 12:18 PM  Result Value Ref Range   WBC 8.1 3.4 - 10.8 x10E3/uL   RBC 5.78 4.14 - 5.80 x10E6/uL   Hemoglobin 17.3 13.0 - 17.7 g/dL   Hematocrit 50.5 37.5 - 51.0 %   MCV 87 79 - 97 fL   MCH 29.9 26.6 - 33.0 pg   MCHC 34.3 31.5 - 35.7 g/dL   RDW 14.0 12.3 - 15.4 %  Platelets 316 150 - 379 x10E3/uL  CMP14+EGFR     Status: Abnormal   Collection Time: 05/13/17 12:18 PM  Result Value Ref Range   Glucose 89 65 - 99 mg/dL   BUN 13 6 - 20 mg/dL   Creatinine, Ser 0.96 0.76 - 1.27 mg/dL   GFR calc non Af Amer 104 >59 mL/min/1.73   GFR calc Af Amer 120 >59 mL/min/1.73   BUN/Creatinine Ratio 14 9 - 20   Sodium 144 134 - 144 mmol/L   Potassium 4.5 3.5 - 5.2 mmol/L   Chloride 101 96 - 106 mmol/L   CO2 26 18 - 29 mmol/L    Comment: **Effective May 23, 2017 Carbon Dioxide, Total**   reference interval will be changing to:              Age                  Male          Male      0 days   - 30 days         83 - 48        16 - 57     31 days   -   1 year         15 - 25        15 - 25      2 years  -  5 years        42 - 38        17 - 18      6 years  - 12 years        60 - 55        19 - 53                >12 years        21 - 12        20 - 29    Calcium 10.3 (H) 8.7 - 10.2 mg/dL   Total Protein 7.6 6.0 - 8.5 g/dL   Albumin 4.7 3.5 - 5.5 g/dL   Globulin, Total 2.9 1.5 - 4.5 g/dL   Albumin/Globulin Ratio 1.6 1.2 - 2.2   Bilirubin Total 0.5 0.0 - 1.2 mg/dL   Alkaline Phosphatase 53 39 - 117 IU/L   AST 23 0 - 40 IU/L   ALT 24 0 - 44 IU/L  TSH     Status: None   Collection Time: 05/13/17 12:18 PM  Result Value Ref Range   TSH 1.700 0.450 - 4.500 uIU/mL    No results found.  ASSESSMENT AND PLAN:  Usama was seen today for neck pain.  Diagnoses and all orders for this visit:  Chronic tension-type headache, intractable: Neuro exam normal.  His posture is practically kyphotic and has to be contributing to his neck pain.  He works with a tablet at work and this has to be contributing. Pred and flexeril for acute relief.  PT should provide him with education and strengthening and potentially alleviate the problem.  -     predniSONE (DELTASONE) 50 MG tablet; Take 1 tab in the morning for five days. -     cyclobenzaprine (FLEXERIL) 10 MG tablet; Take 0.5-1 tablets (5-10 mg total) by mouth 3 (three) times daily as needed.  Cervical neuralgia -     Ambulatory referral to Physical Therapy  Essential hypertension -     POCT glycosylated hemoglobin (Hb A1C) -  CBC -     CMP14+EGFR -     TSH    The patient is advised to call or return to clinic if he does not see an improvement in symptoms, or to seek the care of the closest emergency department if he worsens with the above plan.   Philis Fendt, MHS, PA-C [facility] 05/14/2017 3:07 PM

## 2017-05-13 NOTE — Patient Instructions (Addendum)
  Call me in about 4-5 days if you HA in not improving so we can get the cat scan.    IF you received an x-ray today, you will receive an invoice from [facility]. Please contact [facility] at [phone] with questions or concerns regarding your invoice.   IF you received labwork today, you will receive an invoice from [facility]. Please contact [facility] at [phone] with questions or concerns regarding your invoice.   Our billing staff will not be able to assist you with questions regarding bills from these companies.  You will be contacted with the lab results as soon as they are available. The fastest way to get your results is to activate your My Chart account. Instructions are located on the last page of this paperwork. If you have not heard from us regarding the results in 2 weeks, please contact this office.

## 2017-05-14 LAB — CBC
HEMATOCRIT: 50.5 % (ref 37.5–51.0)
HEMOGLOBIN: 17.3 g/dL (ref 13.0–17.7)
MCH: 29.9 pg (ref 26.6–33.0)
MCHC: 34.3 g/dL (ref 31.5–35.7)
MCV: 87 fL (ref 79–97)
Platelets: 316 10*3/uL (ref 150–379)
RBC: 5.78 x10E6/uL (ref 4.14–5.80)
RDW: 14 % (ref 12.3–15.4)
WBC: 8.1 10*3/uL (ref 3.4–10.8)

## 2017-05-14 LAB — CMP14+EGFR
A/G RATIO: 1.6 (ref 1.2–2.2)
ALBUMIN: 4.7 g/dL (ref 3.5–5.5)
ALT: 24 IU/L (ref 0–44)
AST: 23 IU/L (ref 0–40)
Alkaline Phosphatase: 53 IU/L (ref 39–117)
BUN / CREAT RATIO: 14 (ref 9–20)
BUN: 13 mg/dL (ref 6–20)
Bilirubin Total: 0.5 mg/dL (ref 0.0–1.2)
CALCIUM: 10.3 mg/dL — AB (ref 8.7–10.2)
CO2: 26 mmol/L (ref 18–29)
CREATININE: 0.96 mg/dL (ref 0.76–1.27)
Chloride: 101 mmol/L (ref 96–106)
GFR, EST AFRICAN AMERICAN: 120 mL/min/{1.73_m2} (ref >59)
GFR, EST NON AFRICAN AMERICAN: 104 mL/min/{1.73_m2} (ref >59)
GLOBULIN, TOTAL: 2.9 g/dL (ref 1.5–4.5)
Glucose: 89 mg/dL (ref 65–99)
POTASSIUM: 4.5 mmol/L (ref 3.5–5.2)
SODIUM: 144 mmol/L (ref 134–144)
TOTAL PROTEIN: 7.6 g/dL (ref 6.0–8.5)

## 2017-05-14 LAB — TSH: TSH: 1.7 u[IU]/mL (ref 0.450–4.500)

## 2017-05-16 ENCOUNTER — Telehealth: Payer: Self-pay | Admitting: Physician Assistant

## 2017-05-16 ENCOUNTER — Other Ambulatory Visit: Payer: Self-pay | Admitting: Physician Assistant

## 2017-05-16 DIAGNOSIS — M5412 Radiculopathy, cervical region: Secondary | ICD-10-CM | POA: Diagnosis not present

## 2017-05-16 DIAGNOSIS — G44209 Tension-type headache, unspecified, not intractable: Secondary | ICD-10-CM

## 2017-05-16 MED ORDER — AMLODIPINE BESYLATE 5 MG PO TABS: 5.0000 mg | ORAL_TABLET | Freq: Every day | ORAL | 3 refills | Status: DC

## 2017-05-16 NOTE — Addendum Note (Signed)
Addended by: Baldwin CrownJOHNSON, Loisann Roach D on: 05/16/2017 03:00 PM   Modules accepted: Orders

## 2017-05-16 NOTE — Addendum Note (Signed)
Addended by: Baldwin CrownJOHNSON, SHAQUETTA D on: 05/16/2017 02:13 PM   Modules accepted: Orders

## 2017-05-16 NOTE — Progress Notes (Signed)
Medication not helping his headache.  CT non contrast pending.  Will have him see Dr. Lucia GaskinsAhern routinely for further workup. Deliah BostonMichael Johnhenry Tippin, MS, PA-C 10:56 AM, 05/16/2017

## 2017-05-16 NOTE — Telephone Encounter (Signed)
Patient says the BP medicine from Friday was never called in.  Pt also states that his headache has continued now for six days.  He said that you may want to schedule him a CT for this.  [phone]

## 2017-05-16 NOTE — Progress Notes (Signed)
Please add on a PTH.

## 2017-05-16 NOTE — Telephone Encounter (Signed)
Please advise 

## 2017-05-16 NOTE — Telephone Encounter (Signed)
Sending in norvasc now.  Ordering Head CT non contrast as routine. Will have him see Dr. Ward GivensAherm in neuro as he is not improving with medication. Neuro exam was perfect. Please give him a call and let him know the plan.

## 2017-05-16 NOTE — Telephone Encounter (Signed)
Pt scheduled for 05/17/17 at 10:30 am at [facility] [address] Pt is aware of appt.

## 2017-05-16 NOTE — Telephone Encounter (Signed)
Pt advised  Please schedule ct

## 2017-05-17 ENCOUNTER — Ambulatory Visit
Admission: RE | Admit: 2017-05-17 | Discharge: 2017-05-17 | Disposition: A | Discharge status: Home / Self Care | Payer: BLUE CROSS/BLUE SHIELD | Source: Ambulatory Visit | Attending: Physician Assistant | Admitting: Physician Assistant

## 2017-05-17 DIAGNOSIS — G44209 Tension-type headache, unspecified, not intractable: Secondary | ICD-10-CM

## 2017-05-17 DIAGNOSIS — R42 Dizziness and giddiness: Secondary | ICD-10-CM | POA: Diagnosis not present

## 2017-05-17 DIAGNOSIS — J329 Chronic sinusitis, unspecified: Secondary | ICD-10-CM | POA: Diagnosis not present

## 2017-05-17 LAB — PARATHYROID HORMONE, INTACT (NO CA): PTH: 49 pg/mL (ref 15–65)

## 2017-05-19 ENCOUNTER — Telehealth: Payer: Self-pay | Admitting: Physician Assistant

## 2017-05-19 ENCOUNTER — Encounter: Payer: Self-pay | Admitting: Physician Assistant

## 2017-05-19 NOTE — Telephone Encounter (Signed)
Yes. This is perfect. Deliah BostonMichael Clark, MS, PA-C 1:48 PM, 05/19/2017

## 2017-05-19 NOTE — Telephone Encounter (Signed)
Pt is scheduled with Dr[facility] at [facility] on 06/21/17. Is this soon enough?

## 2017-05-23 ENCOUNTER — Emergency Department (HOSPITAL_COMMUNITY)
Admission: EM | Admit: 2017-05-23 | Discharge: 2017-05-23 | Disposition: A | Discharge status: Home / Self Care | Payer: BLUE CROSS/BLUE SHIELD | Attending: Emergency Medicine | Admitting: Emergency Medicine

## 2017-05-23 ENCOUNTER — Encounter (HOSPITAL_COMMUNITY): Payer: Self-pay | Admitting: *Deleted

## 2017-05-23 ENCOUNTER — Emergency Department (HOSPITAL_COMMUNITY): Payer: BLUE CROSS/BLUE SHIELD

## 2017-05-23 DIAGNOSIS — R51 Headache: Secondary | ICD-10-CM | POA: Diagnosis not present

## 2017-05-23 DIAGNOSIS — Z79899 Other long term (current) drug therapy: Secondary | ICD-10-CM | POA: Diagnosis not present

## 2017-05-23 DIAGNOSIS — G43009 Migraine without aura, not intractable, without status migrainosus: Secondary | ICD-10-CM | POA: Diagnosis not present

## 2017-05-23 DIAGNOSIS — I10 Essential (primary) hypertension: Secondary | ICD-10-CM | POA: Diagnosis not present

## 2017-05-23 DIAGNOSIS — Z87891 Personal history of nicotine dependence: Secondary | ICD-10-CM | POA: Diagnosis not present

## 2017-05-23 DIAGNOSIS — E860 Dehydration: Secondary | ICD-10-CM

## 2017-05-23 HISTORY — DX: Zoster without complications: B02.9

## 2017-05-23 HISTORY — DX: Essential (primary) hypertension: I10

## 2017-05-23 HISTORY — DX: Dorsalgia, unspecified: M54.9

## 2017-05-23 LAB — CBC
HCT: 49.3 % (ref 39.0–52.0)
Hemoglobin: 17.4 g/dL — ABNORMAL HIGH (ref 13.0–17.0)
MCH: 31.1 pg (ref 26.0–34.0)
MCHC: 35.3 g/dL (ref 30.0–36.0)
MCV: 88.2 fL (ref 78.0–100.0)
PLATELETS: 260 10*3/uL (ref 150–400)
RBC: 5.59 MIL/uL (ref 4.22–5.81)
RDW: 12.8 % (ref 11.5–15.5)
WBC: 10.3 10*3/uL (ref 4.0–10.5)

## 2017-05-23 LAB — URINALYSIS, ROUTINE W REFLEX MICROSCOPIC
Bilirubin Urine: NEGATIVE
Glucose, UA: NEGATIVE mg/dL
Ketones, ur: 5 mg/dL — AB
Leukocytes, UA: NEGATIVE
Nitrite: NEGATIVE
Protein, ur: NEGATIVE mg/dL
SPECIFIC GRAVITY, URINE: 1.016 (ref 1.005–1.030)
Squamous Epithelial / LPF: NONE SEEN
pH: 7 (ref 5.0–8.0)

## 2017-05-23 LAB — BASIC METABOLIC PANEL
Anion gap: 9 (ref 5–15)
BUN: 12 mg/dL (ref 6–20)
CALCIUM: 9.3 mg/dL (ref 8.9–10.3)
CO2: 23 mmol/L (ref 22–32)
CREATININE: 0.83 mg/dL (ref 0.61–1.24)
Chloride: 103 mmol/L (ref 101–111)
GFR calc Af Amer: >60 mL/min (ref >60)
GLUCOSE: 85 mg/dL (ref 65–99)
POTASSIUM: 4 mmol/L (ref 3.5–5.1)
SODIUM: 135 mmol/L (ref 135–145)

## 2017-05-23 MED ORDER — SODIUM CHLORIDE 0.9 % IV BOLUS (SEPSIS)
1000.0000 mL | Freq: Once | INTRAVENOUS | Status: AC
  Administered 2017-05-23: 1000 mL via INTRAVENOUS

## 2017-05-23 MED ORDER — DIPHENHYDRAMINE HCL 50 MG/ML IJ SOLN
25.0000 mg | Freq: Once | INTRAMUSCULAR | Status: AC
  Administered 2017-05-23: 25 mg via INTRAVENOUS
  Filled 2017-05-23: qty 1

## 2017-05-23 MED ORDER — SODIUM CHLORIDE 0.9 % IV SOLN: 1000.0000 mL | INTRAVENOUS | Status: DC

## 2017-05-23 MED ORDER — RIZATRIPTAN BENZOATE 5 MG PO TBDP: 5.0000 mg | ORAL_TABLET | ORAL | 0 refills | Status: DC | PRN

## 2017-05-23 MED ORDER — MORPHINE SULFATE (PF) 4 MG/ML IV SOLN
4.0000 mg | Freq: Once | INTRAVENOUS | Status: AC
  Administered 2017-05-23: 4 mg via INTRAVENOUS
  Filled 2017-05-23: qty 1

## 2017-05-23 MED ORDER — METOCLOPRAMIDE HCL 5 MG/ML IJ SOLN
10.0000 mg | Freq: Once | INTRAMUSCULAR | Status: AC
  Administered 2017-05-23: 10 mg via INTRAVENOUS
  Filled 2017-05-23: qty 2

## 2017-05-23 MED ORDER — ONDANSETRON 4 MG PO TBDP: 4.0000 mg | ORAL_TABLET | Freq: Three times a day (TID) | ORAL | 0 refills | Status: DC | PRN

## 2017-05-23 MED ORDER — GADOBENATE DIMEGLUMINE 529 MG/ML IV SOLN
20.0000 mL | Freq: Once | INTRAVENOUS | Status: AC | PRN
  Administered 2017-05-23: 20 mL via INTRAVENOUS

## 2017-05-23 NOTE — ED Notes (Signed)
Pt to MRI

## 2017-05-23 NOTE — ED Provider Notes (Signed)
[facility]-EMERGENCY DEPT Provider Note   CSN: [id number] Arrival date & time: 05/23/17  0850     History   Chief Complaint Chief Complaint  Patient presents with  . Headache    HPI Spencer Salas is a 31 y.o. male.  Pt presents to the ED today with h/a that has been going on for 13 days.  He has seen his doctor who has put him on prednisone, flexeril, and augmentin.  He had a normal head CT on 6/5.  He continues to have a severe headache.  He has taken ibuprofen and tylenol for pain.  The pt does have an appt with neurology, but not until July 10.  The pt has pain to the left side of his neck that radiates up behind his left eye.  He denies any blurry vision or n/v.  He has no previous diagnosis of migraine.  His bp is elevated.  He did take his BP medication today.      Past Medical History:  Diagnosis Date  . Allergy   . Back pain   . Hypertension   . Shingles     There are no active problems to display for this patient.   Past Surgical History:  Procedure Laterality Date  . TONSILECTOMY/ADENOIDECTOMY WITH MYRINGOTOMY         Home Medications    Prior to Admission medications   Medication Sig Start Date End Date Taking? Authorizing Provider  amLODipine (NORVASC) 5 MG tablet Take 1 tablet (5 mg total) by mouth daily. 05/16/17   Ofilia Neas, PA-C  cetirizine (ZYRTEC) 10 MG tablet Take 10 mg by mouth daily as needed for allergies.     [provider]  cyclobenzaprine (FLEXERIL) 10 MG tablet Take 0.5-1 tablets (5-10 mg total) by mouth 3 (three) times daily as needed. 05/13/17   Ofilia Neas, PA-C  esomeprazole (NEXIUM) 20 MG capsule Take 20 mg by mouth daily as needed (for acid reflu).     [provider]  ondansetron (ZOFRAN ODT) 4 MG disintegrating tablet Take 1 tablet (4 mg total) by mouth every 8 (eight) hours as needed. 05/23/17   Jacalyn Lefevre, MD  predniSONE (DELTASONE) 50 MG tablet Take 1 tab in the morning for five days. 05/13/17   Ofilia Neas, PA-C  rizatriptan (MAXALT-MLT) 5 MG disintegrating tablet Take 1 tablet (5 mg total) by mouth as needed for migraine. May repeat in 2 hours if needed 05/23/17   Jacalyn Lefevre, MD    Family History Family History  Problem Relation Age of Onset  . Hypertension Father   . Stroke Maternal Grandfather   . Gallstones Mother   . Heart disease Sister        heart mumur    Social History Social History  Substance Use Topics  . Smoking status: Former Smoker    Types: Cigarettes    Quit date: 12/04/2012  . Smokeless tobacco: Current User     Comment: per patient's health survey form - other (vapor)  . Alcohol use 0.0 oz/week     Comment: 2/month     Allergies   Patient has no known allergies.   Review of Systems Review of Systems  Neurological: Positive for headaches.  All other systems reviewed and are negative.    Physical Exam Updated Vital Signs BP (!) 138/95 (BP Location: Right Arm)   Pulse 64   Temp 98.4 F (36.9 C) (Oral)   Resp 12   SpO2 98%   Physical  Exam  Constitutional: He is oriented to person, place, and time. He appears well-developed and well-nourished.  HENT:  Head: Normocephalic and atraumatic.  Right Ear: External ear normal.  Left Ear: External ear normal.  Nose: Nose normal.  Mouth/Throat: Oropharynx is clear and moist.  Eyes: Conjunctivae and EOM are normal. Pupils are equal, round, and reactive to light.  Neck: Normal range of motion. Neck supple.  Cardiovascular: Normal rate, regular rhythm, normal heart sounds and intact distal pulses.   Pulmonary/Chest: Effort normal and breath sounds normal.  Abdominal: Soft. Bowel sounds are normal.  Musculoskeletal: Normal range of motion.  Neurological: He is alert and oriented to person, place, and time.  Skin: Skin is warm.  Psychiatric: He has a normal mood and affect. His behavior is normal. Judgment and thought content normal.  Nursing note and vitals reviewed.    ED Treatments /  Results  Labs (all labs ordered are listed, but only abnormal results are displayed) Labs Reviewed  CBC - Abnormal; Notable for the following:       Result Value   Hemoglobin 17.4 (*)    All other components within normal limits  URINALYSIS, ROUTINE W REFLEX MICROSCOPIC - Abnormal; Notable for the following:    Hgb urine dipstick SMALL (*)    Ketones, ur 5 (*)    Bacteria, UA RARE (*)    All other components within normal limits  BASIC METABOLIC PANEL  ROCKY MTN SPOTTED FVR ABS PNL(IGG+IGM)    EKG  EKG Interpretation None       Radiology Mr Laqueta Jean And Wo Contrast  Result Date: 05/23/2017 CLINICAL DATA:  Headache EXAM: MRI HEAD WITHOUT AND WITH CONTRAST TECHNIQUE: Multiplanar, multiecho pulse sequences of the brain and surrounding structures were obtained without and with intravenous contrast. CONTRAST:  20mL MULTIHANCE GADOBENATE DIMEGLUMINE 529 MG/ML IV SOLN COMPARISON:  CT head 05/17/2017 FINDINGS: Brain: No acute infarction, hemorrhage, hydrocephalus, extra-axial collection or mass lesion. Normal enhancement post contrast administration. Vascular: Normal arterial enhancement.  Normal venous enhancement. Skull and upper cervical spine: Negative Sinuses/Orbits: Retention cyst right maxillary sinus. Remaining sinuses clear. Normal orbit. Other: None IMPRESSION: Negative MRI of the head with contrast Mucous retention cyst right maxillary sinus. Electronically Signed   By: Marlan Palau M.D.   On: 05/23/2017 13:26    Procedures Procedures (including critical care time)  Medications Ordered in ED Medications  sodium chloride 0.9 % bolus 1,000 mL (0 mLs Intravenous Stopped 05/23/17 1413)    Followed by  0.9 %  sodium chloride infusion (not administered)  metoCLOPramide (REGLAN) injection 10 mg (10 mg Intravenous Given 05/23/17 1150)  diphenhydrAMINE (BENADRYL) injection 25 mg (25 mg Intravenous Given 05/23/17 1150)  gadobenate dimeglumine (MULTIHANCE) injection 20 mL (20 mLs  Intravenous Contrast Given 05/23/17 1313)  morphine 4 MG/ML injection 4 mg (4 mg Intravenous Given 05/23/17 1422)  sodium chloride 0.9 % bolus 1,000 mL (1,000 mLs Intravenous New Bag/Given 05/23/17 1422)     Initial Impression / Assessment and Plan / ED Course  I have reviewed the triage vital signs and the nursing notes.  Pertinent labs & imaging results that were available during my care of the patient were reviewed by me and considered in my medical decision making (see chart for details).     I ordered a MRI b/c pt had a ct and still has a severe ha.  MRI nl.  Pain has improved after morphine.   I suspect pt has migraines, so will be d/c with maxalt.  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  He has a neurology appt next month.  He knows to return if worse.  Final Clinical Impressions(s) / ED Diagnoses   Final diagnoses:  Migraine without aura and without status migrainosus, not intractable  Dehydration    New Prescriptions New Prescriptions   ONDANSETRON (ZOFRAN ODT) 4 MG DISINTEGRATING TABLET    Take 1 tablet (4 mg total) by mouth every 8 (eight) hours as needed.   RIZATRIPTAN (MAXALT-MLT) 5 MG DISINTEGRATING TABLET    Take 1 tablet (5 mg total) by mouth as needed for migraine. May repeat in 2 hours if needed     Jacalyn LefevreHaviland, Zai Chmiel, MD 05/23/17 1459

## 2017-05-23 NOTE — ED Triage Notes (Signed)
C/o headache onset 13 days ago c/o neck pain was seen by PCP was muscle relaxant , pred.  And after 5 days still had headache went back to md has Ct was started on augmentin, and tylenol no relief. States he has a scheduled appointment with neuro. On July 10

## 2017-05-23 NOTE — ED Notes (Signed)
Pt returned from MRI °

## 2017-05-25 LAB — ROCKY MTN SPOTTED FVR ABS PNL(IGG+IGM)
RMSF IGG: NEGATIVE
RMSF IgM: 0.5 index (ref 0.00–0.89)

## 2017-06-21 ENCOUNTER — Ambulatory Visit: Payer: Self-pay | Admitting: Neurology

## 2017-07-13 ENCOUNTER — Encounter: Payer: Self-pay | Admitting: Neurology

## 2017-07-13 ENCOUNTER — Ambulatory Visit (INDEPENDENT_AMBULATORY_CARE_PROVIDER_SITE_OTHER): Payer: BLUE CROSS/BLUE SHIELD | Admitting: Neurology

## 2017-07-13 VITALS — BP 154/107 | HR 72 | Ht 72.0 in | Wt 263.0 lb

## 2017-07-13 DIAGNOSIS — R0683 Snoring: Secondary | ICD-10-CM

## 2017-07-13 DIAGNOSIS — R0681 Apnea, not elsewhere classified: Secondary | ICD-10-CM

## 2017-07-13 DIAGNOSIS — M5412 Radiculopathy, cervical region: Secondary | ICD-10-CM | POA: Diagnosis not present

## 2017-07-13 DIAGNOSIS — M5481 Occipital neuralgia: Secondary | ICD-10-CM

## 2017-07-13 DIAGNOSIS — M2480 Other specific joint derangements of unspecified joint, not elsewhere classified: Secondary | ICD-10-CM | POA: Diagnosis not present

## 2017-07-13 DIAGNOSIS — M542 Cervicalgia: Secondary | ICD-10-CM | POA: Diagnosis not present

## 2017-07-13 MED ORDER — METHYLPREDNISOLONE 4 MG PO TBPK: ORAL_TABLET | ORAL | 1 refills | Status: DC

## 2017-07-13 NOTE — Patient Instructions (Addendum)
Remember to drink plenty of fluid, eat healthy meals and do not skip any meals. Try to eat protein with a every meal and eat a healthy snack such as fruit or nuts in between meals. Try to keep a regular sleep-wake schedule and try to exercise daily, particularly in the form of walking, 20-30 minutes a day, if you can.   Sleep eval for OSA Maxillary retention cyst: appears asymptomatic, consider ENT Physical Therapy for occipital neuralgia MRI cervical spine Continue Neurontin  Occipital Neuralgia Occipital neuralgia is a type of headache that causes episodes of very bad pain in the back of your head. Pain from occipital neuralgia may spread (radiate) to other parts of your head. The pain is usually brief and often goes away after you rest and relax. These headaches may be caused by irritation of the nerves that leave your spinal cord high up in your neck, just below the base of your skull (occipital nerves). Your occipital nerves transmit sensations from the back of your head, the top of your head, and the areas behind your ears. What are the causes? Occipital neuralgia can occur without any known cause (primary headache syndrome). In other cases, occipital neuralgia is caused by pressure on or irritation of one of the two occipital nerves. Causes of occipital nerve compression or irritation include:  Wear and tear of the vertebrae in the neck (osteoarthritis).  Neck injury.  Disease of the disks that separate the vertebrae.  Tumors.  Gout.  Infections.  Diabetes.  Swollen blood vessels that put pressure on the occipital nerves.  Muscle spasm in the neck.  What are the signs or symptoms? Pain is the main symptom of occipital neuralgia. It usually starts in the back of the head but may also be felt in other areas supplied by the occipital nerves. Pain is usually on one side but may be on both sides. You may have:  Brief episodes of very bad pain that is burning, stabbing, shocking,  or shooting.  Pain behind the eye.  Pain triggered by neck movement or hair brushing.  Scalp tenderness.  Aching in the back of the head between episodes of very bad pain.  How is this diagnosed? Your health care provider may diagnose occipital neuralgia based on your symptoms and a physical exam. During the exam, the health care provider may push on areas supplied by the occipital nerves to see if they are painful. Some tests may also be done to help in making the diagnosis. These may include:  Imaging studies of the upper spinal cord, such as an MRI or CT scan. These may show compression or spinal cord abnormalities.  Nerve block. You will get an injection of numbing medicine (local anesthetic) near the occipital nerve to see if this relieves pain.  How is this treated? Treatment may begin with simple measures, such as:  Rest.  Massage.  Heat.  Over-the-counter pain relievers.  If these measures do not work, you may need other treatments, including:  Medicines such as: ? Prescription-strength anti-inflammatory medicines. ? Muscle relaxants. ? Antiseizure medicines. ? Antidepressants.  Steroid injection. This involves injections of local anesthetic and strong anti-inflammatory drugs (steroids).  Pulsed radiofrequency. Wires are implanted to deliver electrical impulses that block pain signals from the occipital nerve.  Physical therapy.  Surgery to relieve nerve pressure.  Follow these instructions at home:  Take all medicines as directed by your health care provider.  Avoid activities that cause pain.  Rest when you have an attack  of pain.  Try gentle massage or a heating pad to relieve pain.  Work with a physical therapist to learn stretching exercises you can do at home.  Try a different pillow or sleeping position.  Practice good posture.  Try to stay active. Get regular exercise that does not cause pain. Ask your health care provider to suggest safe  exercises for you.  Keep all follow-up visits as directed by your health care provider. This is important. Contact a health care provider if:  Your medicine is not working.  You have new or worsening symptoms. Get help right away if:  You have very bad head pain that is not going away.  You have a sudden change in vision, balance, or speech. This information is not intended to replace advice given to you by your health care provider. Make sure you discuss any questions you have with your health care provider. Document Released: 11/23/2001 Document Revised: 05/06/2016 Document Reviewed: 11/21/2013 Elsevier Interactive Patient Education  2017 [employer]   I would like to see you back as needed, sooner if we need to. Please call us with any interim questions, concerns, problems, updates or refill requests.   Our phone number is [phone]. We also have an after hours call service for urgent matters and there is a physician on-call for urgent questions. For any emergencies you know to call 911 or go to the nearest emergency room

## 2017-07-13 NOTE — Progress Notes (Signed)
Nerve block:  Xylocaine 2% (20 mg/ml) NDC [id number] Lot [id number] Exp 11/20  Bupivacaine 0.5% (5mg /ml) NDC 47829-562-1355150-250-50 BATCH: [id number] Exp: 02/2019  Depo-medrol 80 mg/ml NDC 6295-2841-320009-3475-01 Lot [id number] Exp 05/2018  Four 3 ml syringes  2 w/ 1 ml marcaine, 1 ml lidocaine and 1 ml depo 1 w/ 1.5 ml marcaine and 1.5 ml lidocaine 1 w/ 3 ml marcaine

## 2017-07-13 NOTE — Progress Notes (Signed)
[facility]    Provider:  Dr Lucia GaskinsAhern Referring Provider: Ofilia Neaslark, Michael L, PA-C Primary Care Physician:  Ofilia Neaslark, Michael L, PA-C  CC:  headache  HPI:  Spencer Salas is a 31 y.o. male here as a referral from Dr. Chestine Sporelark for chronic tension type headache. No history of headaches or severe headaches, or migraines. No Fhx. 2 months ago he woke up with a very stiff neck, his neck pops and cracks, has been to a chiropractor in the past for this. By the evening he developed a headache which lingered for days. [facility]. The headaches were throbbing in the back of the neck, would radiate to the left eye, then in the emples, no light or sound sensitivity or throbbing, shooting sharp pain superimposed on the headache. He was later seen at the hospital after 3 weeks of headaches, MRI and CT was unremarkable. Gabapentin has helped. Not overusing OTC medications. If he does not take the Gabapentin he has a headache 24x7. He is on 400mg  three times a day and that significantly helps but if he doesn't take it the headache is severe. Never had any migrainous features, migraine cocktail did not help. His neck still hurts and is tight. It pops, decreased ROM, he has shooting pains down the arms precipitated by neck movement. He has a lot of back pain, he is constantly flexing neck. He has a lot of snoring, his wife can't sleep with him because it so loud, tired during the day and morning headaches, apneic events.  Reviewed notes, labs and imaging from outside physicians, which showed:   Reviewed emergency notes. Patient was seen in the emergency room in June of this year. Headaches had been going on for 13 days. He was started on prednisone, Flexeril and Augmentin. Head CT was normal. Continued to have severe headache, ibuprofen and Tylenol not helping. Patient having pain to the left side of his neck that radiates up behind his eye. He did not complain of blurry vision or nausea vomiting  or fevers. No previous diagnosis of migraine. Blood pressure was elevated. Diagnosed with migraine in the emergency room and given Zofran, Maxalt. Patient was seen previously by his primary care physician complaining of daily headache ongoing for 3 months. He reported they stemmed from his neck. Severe 7 out of 10, constant, bilateral and changes with neck movement. Associated neck pain. He tried meloxicam, heating pad, Excedrin, Xanax. He was given prednisone and Flexeril by his primary care physician, referred to physical therapy, labs were drawn.  Reviewed CT of the head and agree with the following: Paranasal sinus disease with sizable retention cyst in the inferior right maxillary antrum. No intracranial mass, hemorrhage, or extra-axial fluid collection. Gray-white compartments are within normal limits. Reviewed images of the MRI of the brain which concurred with the above findings. Reviewed images with patient as well.   Review of Systems: Patient complains of symptoms per HPI as well as the following symptoms: no CP, no SOB. Pertinent negatives and positives per HPI. All others negative.   Social History   Social History  . Marital status: Single    Spouse name: N/A  . Number of children: N/A  . Years of education: N/A   Occupational History  . Not on file.   Social History Main Topics  . Smoking status: Former Smoker    Types: Cigarettes    Quit date: 12/04/2012  . Smokeless tobacco: Current User     Comment: per patient's  health survey form - other (vapor)  . Alcohol use 0.0 oz/week     Comment: 2/month  . Drug use: No  . Sexual activity: Yes     Comment: number of sex partners in the last 12 months 1 - Spencer Salas   Other Topics Concern  . Not on file   Social History Narrative  . No narrative on file    Family History  Problem Relation Age of Onset  . Hypertension Father   . Stroke Maternal Grandfather   . Gallstones Mother   . Heart disease Sister         heart mumur    Past Medical History:  Diagnosis Date  . Allergy   . Back pain   . Hypertension   . Shingles     Past Surgical History:  Procedure Laterality Date  . TONSILECTOMY/ADENOIDECTOMY WITH MYRINGOTOMY      Current Outpatient Prescriptions  Medication Sig Dispense Refill  . amLODipine (NORVASC) 5 MG tablet Take 1 tablet (5 mg total) by mouth daily. 90 tablet 3  . cetirizine (ZYRTEC) 10 MG tablet Take 10 mg by mouth daily as needed for allergies.     . cyclobenzaprine (FLEXERIL) 10 MG tablet Take 0.5-1 tablets (5-10 mg total) by mouth 3 (three) times daily as needed. 30 tablet 0  . gabapentin (NEURONTIN) 400 MG capsule Take 400 mg by mouth 3 (three) times daily.    . methylPREDNISolone (MEDROL DOSEPAK) 4 MG TBPK tablet follow package directions 21 tablet 1   No current facility-administered medications for this visit.     Allergies as of 07/13/2017  . (No Known Allergies)    Vitals: BP (!) 154/107   Pulse 72   Ht 6' (1.829 m)   Wt 263 lb (119.3 kg)   BMI 35.67 kg/m  Last Weight:  Wt Readings from Last 1 Encounters:  07/13/17 263 lb (119.3 kg)   Last Height:   Ht Readings from Last 1 Encounters:  07/13/17 6' (1.829 m)   Physical exam: Exam: Gen: NAD, conversant, well nourised, obese, well groomed                     CV: RRR, no MRG. No Carotid Bruits. No peripheral edema, warm, nontender Eyes: Conjunctivae clear without exudates or hemorrhage  Neuro: Detailed Neurologic Exam  Speech:    Speech is normal; fluent and spontaneous with normal comprehension.  Cognition:    The patient is oriented to person, place, and time;     recent and remote memory intact;     language fluent;     normal attention, concentration,     fund of knowledge Cranial Nerves:    The pupils are equal, round, and reactive to light. The fundi are normal and spontaneous venous pulsations are present. Visual fields are full to finger confrontation. Extraocular movements are  intact. Trigeminal sensation is intact and the muscles of mastication are normal. The face is symmetric. The palate elevates in the midline. Hearing intact. Voice is normal. Shoulder shrug is normal. The tongue has normal motion without fasciculations.   Coordination:    Normal finger to nose and heel to shin. Normal rapid alternating movements.   Gait:    Heel-toe and tandem gait are normal.   Motor Observation:    No asymmetry, no atrophy, and no involuntary movements noted. Tone:    Normal muscle tone.    Posture:    Posture is normal. normal erect    Strength:  Strength is V/V in the upper and lower limbs.      Sensation: intact to LT     Reflex Exam:  DTR's:    Deep tendon reflexes in the upper and lower extremities are normal bilaterally.   Toes:    The toes are downgoing bilaterally.   Clonus:    Clonus is absent.       Assessment/Plan:  31 year old male with new onset occipital neuralgia and neck pain with movement, chronic neck pain, cervical radiculopathy, cervical spine crepitus. Neuro exam shows significant cervical spine crepitus. Pain is bilateral and is located in the distribution of the greater, lesser and/or third occipital nerves, paroxysmal and brief, painful, sharp, with tenderness and trigger points at the emergence of the greater occipital nerve. Differential includes pain in the upper cervical joints or disks, suboccipital or upper posterior neck muscles including the traps/scm, spinal and posterior cranial fossa dura mater, vertebral arteries, structural and infiltrative lesions such as meningioma, schwannoma, myelitis, compressive disk disease and others. Will need MRI of the  cervical spine to evaluate for all the reasons above as well as chronic neck pain, crepitus, radiculopathy for surgical options.   Sleep eval: He has a lot of snoring, his wife can't sleep with him because it so loud, tired during the day and morning headaches, .  Maxillary  retention cyst: appears asymptomatic, consider ENT Physical Therapy for occipital neuralgia and neck pain. Please include massage/manual therapy, stretching, strengthening, TENS unit, or any modality clinically warranted by evaluation. MRI cervical spine as explained above Continue Neurontin  Orders Placed This Encounter  Procedures  . MR CERVICAL SPINE WO CONTRAST  . Ambulatory referral to Physical Therapy  . Ambulatory referral to Sleep Studies    To prevent or relieve headaches, try the following: Cool Compress. Lie down and place a cool compress on your head.  Avoid headache triggers. If certain foods or odors seem to have triggered your migraines in the past, avoid them. A headache diary might help you identify triggers.  Include physical activity in your daily routine. Try a daily walk or other moderate aerobic exercise.  Manage stress. Find healthy ways to cope with the stressors, such as delegating tasks on your to-do list.  Practice relaxation techniques. Try deep breathing, yoga, massage and visualization.  Eat regularly. Eating regularly scheduled meals and maintaining a healthy diet might help prevent headaches. Also, drink plenty of fluids.  Follow a regular sleep schedule. Sleep deprivation might contribute to headaches Consider biofeedback. With this mind-body technique, you learn to control certain bodily functions - such as muscle tension, heart rate and blood pressure - to prevent headaches or reduce headache pain.    Proceed to emergency room if you experience new or worsening symptoms or symptoms do not resolve, if you have new neurologic symptoms or if headache is severe, or for any concerning symptom.   Provided education and documentation from American headache Society toolbox including articles on: chronic migraine medication overuse headache, chronic migraines, prevention of migraines, behavioral and other nonpharmacologic treatments for headache.  Performed nerve  blocks today:  Performed by Dr. Lucia Gaskins M.D. All procedures a documented blood were medically necessary, reasonable and appropriate based on the patient's history, medical diagnosis and physician opinion. Verbal informed consent was obtained from the patient, patient was informed of potential risk of procedure, including bruising, bleeding, hematoma formation, infection, muscle weakness, muscle pain, numbness, transient hypertension, transient hyperglycemia and transient insomnia among others. All areas injected were topically clean  with isopropyl rubbing alcohol. Nonsterile nonlatex gloves were worn during the procedure.  1. Greater occipital nerve block [phone](64405). The greater occipital nerve site was identified at the nuchal line medial to the occipital artery. Medication was injected into the left and right occipital nerve areas and suboccipital areas. Patient's condition is associated with inflammation of the greater occipital nerve and associated multiple groups. Injection was deemed medically necessary, reasonable and appropriate. Injection represents a separate and unique surgical service.  2. Lesser occipital nerve block (64450) and Auriculotemporal nerve block (64450). The lesser occipital nerve site was identified approximately 2 cm lateral to the greater occipital nerve. Occasion was injected into the left and right occipital nerve areas. Patient's condition is associated with inflammation of the lesser occipital nerve and associated muscle groups. Injection was deemed medically necessary, reasonable and appropriate. Injection represents a separate and unique surgical service. Auriculotemporal nerve block [phone](64450): The Auriculotemporal nerve site was identified along the posterior margin of the sternocleidomastoid muscle toward the base of the ear. Medication was injected into the left and right radicular temporal nerve areas. Patient's condition is associated with inflammation of the Auriculotemporal  Nerve and associated muscle groups. Injection was deemed medically necessary, reasonable and appropriate. Injection represents a separate and unique surgical service.   Cc: Ofilia Neaslark, Michael L, PA-C   Naomie DeanAntonia Ahern, MD  [facility] [address] [place], [place] [id number]  Phone [phone] Fax [phone]

## 2017-07-14 ENCOUNTER — Telehealth: Payer: Self-pay | Admitting: Neurology

## 2017-07-14 NOTE — Telephone Encounter (Signed)
Pt returned Danielle's to schedule MRI

## 2017-07-17 ENCOUNTER — Encounter: Payer: Self-pay | Admitting: Neurology

## 2017-07-18 NOTE — Telephone Encounter (Signed)
I called the patient back and he is scheduled for 08/03/17 at our [facility] mobile unit.

## 2017-07-20 ENCOUNTER — Encounter: Payer: Self-pay | Admitting: Neurology

## 2017-07-27 NOTE — Telephone Encounter (Signed)
Received faxed [facility]neaLink report from [facility]. Sleep study results were discussed w/ pt per fax and he was started on home Cpap (auto-titrate). Sent to med records for scanning, copy to Dr. Lucia GaskinsAhern for review.

## 2017-08-03 ENCOUNTER — Ambulatory Visit (INDEPENDENT_AMBULATORY_CARE_PROVIDER_SITE_OTHER): Payer: BLUE CROSS/BLUE SHIELD

## 2017-08-03 DIAGNOSIS — M542 Cervicalgia: Secondary | ICD-10-CM

## 2017-08-03 DIAGNOSIS — M5481 Occipital neuralgia: Secondary | ICD-10-CM

## 2017-08-03 DIAGNOSIS — M2480 Other specific joint derangements of unspecified joint, not elsewhere classified: Secondary | ICD-10-CM | POA: Diagnosis not present

## 2017-08-03 DIAGNOSIS — M5412 Radiculopathy, cervical region: Secondary | ICD-10-CM | POA: Diagnosis not present

## 2017-08-05 ENCOUNTER — Telehealth: Payer: Self-pay | Admitting: *Deleted

## 2017-08-05 NOTE — Telephone Encounter (Signed)
Spoke to pt and relayed that his MRI C spine was normal.  He verbalized understanding of results but was still with sx.

## 2017-08-08 ENCOUNTER — Telehealth: Payer: Self-pay | Admitting: *Deleted

## 2017-08-08 NOTE — Telephone Encounter (Signed)
Advised patient of normal MRI results per previous message. °

## 2017-08-08 NOTE — Telephone Encounter (Signed)
Noted/fim 

## 2017-08-08 NOTE — Telephone Encounter (Signed)
-----   Message from Anson Fret, MD sent at 08/06/2017  2:38 PM EDT ----- Looks like he has severe sleep apnea and he is being started on a cpap. Let patient know this can be a cause of his headaches and it may take several months on the cpap to see a difference and I highly encourage ocmpliance with cpap. MRI of the cervical spine was normal. thanks

## 2017-08-08 NOTE — Telephone Encounter (Signed)
LMOM with below results; encouraged compliance with CPAP.  He does not need to return this call unless he has questions/fim

## 2017-08-08 NOTE — Telephone Encounter (Signed)
-----   Message from Anson Fret, MD sent at 08/06/2017  2:39 PM EDT ----- MRI cervical spine normal thanks

## 2017-08-08 NOTE — Telephone Encounter (Signed)
LMOM with below MRI results.  He does not need to return this call unless he has questions/fim 

## 2017-08-10 ENCOUNTER — Institutional Professional Consult (permissible substitution): Payer: Self-pay | Admitting: Neurology

## 2017-09-05 ENCOUNTER — Ambulatory Visit: Payer: BLUE CROSS/BLUE SHIELD | Attending: Neurology | Admitting: Rehabilitative and Restorative Service Providers"

## 2019-04-16 DIAGNOSIS — S61211A Laceration without foreign body of left index finger without damage to nail, initial encounter: Secondary | ICD-10-CM | POA: Diagnosis not present

## 2019-04-16 DIAGNOSIS — F17200 Nicotine dependence, unspecified, uncomplicated: Secondary | ICD-10-CM | POA: Diagnosis not present

## 2021-03-23 ENCOUNTER — Emergency Department (HOSPITAL_BASED_OUTPATIENT_CLINIC_OR_DEPARTMENT_OTHER)
Admission: EM | Admit: 2021-03-23 | Discharge: 2021-03-23 | Disposition: A | Discharge status: Home / Self Care | Payer: Self-pay | Attending: Emergency Medicine | Admitting: Emergency Medicine

## 2021-03-23 ENCOUNTER — Other Ambulatory Visit: Payer: Self-pay

## 2021-03-23 ENCOUNTER — Emergency Department (HOSPITAL_BASED_OUTPATIENT_CLINIC_OR_DEPARTMENT_OTHER): Payer: Self-pay | Admitting: Radiology

## 2021-03-23 DIAGNOSIS — Z79899 Other long term (current) drug therapy: Secondary | ICD-10-CM | POA: Insufficient documentation

## 2021-03-23 DIAGNOSIS — R0789 Other chest pain: Secondary | ICD-10-CM

## 2021-03-23 DIAGNOSIS — Z720 Tobacco use: Secondary | ICD-10-CM | POA: Insufficient documentation

## 2021-03-23 DIAGNOSIS — I10 Essential (primary) hypertension: Secondary | ICD-10-CM | POA: Insufficient documentation

## 2021-03-23 LAB — D-DIMER, QUANTITATIVE: D-Dimer, Quant: <0.27 ug/mL-FEU (ref 0.00–0.50)

## 2021-03-23 LAB — CBC
HCT: 49.9 % (ref 39.0–52.0)
Hemoglobin: 17.3 g/dL — ABNORMAL HIGH (ref 13.0–17.0)
MCH: 31.3 pg (ref 26.0–34.0)
MCHC: 34.7 g/dL (ref 30.0–36.0)
MCV: 90.4 fL (ref 80.0–100.0)
Platelets: 233 10*3/uL (ref 150–400)
RBC: 5.52 MIL/uL (ref 4.22–5.81)
RDW: 12.6 % (ref 11.5–15.5)
WBC: 11 10*3/uL — ABNORMAL HIGH (ref 4.0–10.5)
nRBC: 0 % (ref 0.0–0.2)

## 2021-03-23 LAB — BASIC METABOLIC PANEL
Anion gap: 8 (ref 5–15)
BUN: 12 mg/dL (ref 6–20)
CO2: 26 mmol/L (ref 22–32)
Calcium: 9.5 mg/dL (ref 8.9–10.3)
Chloride: 105 mmol/L (ref 98–111)
Creatinine, Ser: 0.8 mg/dL (ref 0.61–1.24)
GFR, Estimated: >60 mL/min (ref >60)
Glucose, Bld: 92 mg/dL (ref 70–99)
Potassium: 3.6 mmol/L (ref 3.5–5.1)
Sodium: 139 mmol/L (ref 135–145)

## 2021-03-23 LAB — TROPONIN I (HIGH SENSITIVITY)
Troponin I (High Sensitivity): 3 ng/L (ref <18)
Troponin I (High Sensitivity): 4 ng/L (ref <18)

## 2021-03-23 MED ORDER — AMLODIPINE BESYLATE 5 MG PO TABS
5.0000 mg | ORAL_TABLET | Freq: Once | ORAL | Status: AC
Start: 2021-03-23 — End: 2021-03-23
  Administered 2021-03-23: 5 mg via ORAL
  Filled 2021-03-23: qty 1

## 2021-03-23 MED ORDER — AMLODIPINE BESYLATE 5 MG PO TABS: 5.0000 mg | ORAL_TABLET | Freq: Every day | ORAL | 0 refills | Status: DC

## 2021-03-23 MED ORDER — LABETALOL HCL 5 MG/ML IV SOLN
10.0000 mg | Freq: Once | INTRAVENOUS | Status: AC
  Administered 2021-03-23: 10 mg via INTRAVENOUS
  Filled 2021-03-23: qty 4

## 2021-03-23 NOTE — ED Triage Notes (Signed)
Reports intermittent left chest pain radiating to left shoulder blade. Shortness of breath , smoker . Nausea.

## 2021-03-23 NOTE — ED Provider Notes (Signed)
[facility] [place] EMERGENCY DEPT Provider Note   CSN: [id number] Arrival date & time: 03/23/21  1449     History Chief Complaint  Patient presents with  . Chest Pain    Spencer Salas is a 35 y.o. male.  Pt presents to the ED today with CP.  The pt has had intermittent cp since Wednesday, April 6.  Pain is a pressure sensation.  It comes and goes.  Nothing in particular makes it worse or better.  Pt has htn and has been out of his amlodipine for a few months.  He said he's in between insurances and his new job's insurance has not kicked in yet.  Pt smokes.  No hx cad.        Past Medical History:  Diagnosis Date  . Allergy   . Back pain   . Hypertension   . Shingles     There are no problems to display for this patient.   Past Surgical History:  Procedure Laterality Date  . TONSILECTOMY/ADENOIDECTOMY WITH MYRINGOTOMY         Family History  Problem Relation Age of Onset  . Hypertension Father   . Stroke Maternal Grandfather   . Gallstones Mother   . Heart disease Sister        heart mumur    Social History   Tobacco Use  . Smoking status: Former Smoker    Types: Cigarettes    Quit date: 12/04/2012    Years since quitting: 8.3  . Smokeless tobacco: Current User  . Tobacco comment: per patient's health survey form - other (vapor)  Vaping Use  . Vaping Use: Every day  Substance Use Topics  . Alcohol use: Yes    Alcohol/week: 0.0 standard drinks    Comment: 2/month  . Drug use: No    Home Medications Prior to Admission medications   Medication Sig Start Date End Date Taking? Authorizing Provider  amLODipine (NORVASC) 5 MG tablet Take 1 tablet (5 mg total) by mouth daily. 03/23/21   Jacalyn Lefevre, MD  cetirizine (ZYRTEC) 10 MG tablet Take 10 mg by mouth daily as needed for allergies.     [provider]  cyclobenzaprine (FLEXERIL) 10 MG tablet Take 0.5-1 tablets (5-10 mg total) by mouth 3 (three) times daily as needed. 05/13/17    Ofilia Neas, PA-C  gabapentin (NEURONTIN) 400 MG capsule Take 400 mg by mouth 3 (three) times daily.    [provider]  methylPREDNISolone (MEDROL DOSEPAK) 4 MG TBPK tablet follow package directions 07/13/17   Anson Fret, MD    Allergies    Patient has no known allergies.  Review of Systems   Review of Systems  Cardiovascular: Positive for chest pain.  All other systems reviewed and are negative.   Physical Exam Updated Vital Signs BP (!) 158/92   Pulse 71   Temp 98.2 F (36.8 C) (Oral)   Resp 20   Ht 6' (1.829 m)   Wt 120.2 kg   SpO2 98%   BMI 35.94 kg/m   Physical Exam Vitals and nursing note reviewed.  Constitutional:      Appearance: He is well-developed. He is obese.  HENT:     Head: Normocephalic and atraumatic.  Eyes:     Extraocular Movements: Extraocular movements intact.     Pupils: Pupils are equal, round, and reactive to light.  Cardiovascular:     Rate and Rhythm: Normal rate and regular rhythm.     Heart sounds: Normal  heart sounds.  Pulmonary:     Effort: Pulmonary effort is normal.     Breath sounds: Normal breath sounds.  Abdominal:     General: Bowel sounds are normal.     Palpations: Abdomen is soft.  Musculoskeletal:        General: Normal range of motion.     Cervical back: Normal range of motion and neck supple.  Skin:    General: Skin is warm.     Capillary Refill: Capillary refill takes less than 2 seconds.  Neurological:     General: No focal deficit present.     Mental Status: He is alert and oriented to person, place, and time.  Psychiatric:        Mood and Affect: Mood normal.        Behavior: Behavior normal.     ED Results / Procedures / Treatments   Labs (all labs ordered are listed, but only abnormal results are displayed) Labs Reviewed  CBC - Abnormal; Notable for the following components:      Result Value   WBC 11.0 (*)    Hemoglobin 17.3 (*)    All other components within normal limits   D-DIMER, QUANTITATIVE  BASIC METABOLIC PANEL  TROPONIN I (HIGH SENSITIVITY)  TROPONIN I (HIGH SENSITIVITY)    EKG EKG Interpretation  Date/Time:  Monday March 23 2021 14:57:01 EDT Ventricular Rate:  89 PR Interval:  144 QRS Duration: 112 QT Interval:  360 QTC Calculation: 438 R Axis:   29 Text Interpretation: Sinus rhythm Borderline intraventricular conduction delay ST elev, probable normal early repol pattern Baseline wander in lead(s) V6 No significant change since last tracing Confirmed by Jacalyn Lefevre [phone]) on 03/23/2021 2:59:49 PM   Radiology DG Chest 2 View  Result Date: 03/23/2021 CLINICAL DATA:  Intermittent left-sided chest pain for 1 week. Shortness of breath EXAM: CHEST - 2 VIEW COMPARISON:  06/03/2009 FINDINGS: The cardiomediastinal contours are normal. Mild bronchial thickening. Pulmonary vasculature is normal. No consolidation, pleural effusion, or pneumothorax. No acute osseous abnormalities are seen. IMPRESSION: Mild bronchial thickening. Electronically Signed   By: Narda Rutherford M.D.   On: 03/23/2021 15:35    Procedures Procedures   Medications Ordered in ED Medications  amLODipine (NORVASC) tablet 5 mg (5 mg Oral Given 03/23/21 1531)  labetalol (NORMODYNE) injection 10 mg (10 mg Intravenous Given 03/23/21 1653)    ED Course  I have reviewed the triage vital signs and the nursing notes.  Pertinent labs & imaging results that were available during my care of the patient were reviewed by me and considered in my medical decision making (see chart for details).    MDM Rules/Calculators/A&P                          Cardiac work up is unremarkable.  Heart score of 1.  Pt's bp has improved.  Pt will be d/c with a rx for amlodipine and instructed to establish care with pcp.  He is going to try to stop smoking.   He is to return if worse. Final Clinical Impression(s) / ED Diagnoses Final diagnoses:  Hypertension, unspecified type  Atypical chest pain   Tobacco abuse    Rx / DC Orders ED Discharge Orders         Ordered    amLODipine (NORVASC) 5 MG tablet  Daily        03/23/21 1749           Haziel Molner,  Raynelle Fanning, MD 03/23/21 [phone]

## 2021-03-23 NOTE — Discharge Instructions (Addendum)
Try to stop smoking. °

## 2021-03-24 ENCOUNTER — Telehealth (HOSPITAL_BASED_OUTPATIENT_CLINIC_OR_DEPARTMENT_OTHER): Payer: Self-pay

## 2021-03-24 ENCOUNTER — Telehealth (HOSPITAL_BASED_OUTPATIENT_CLINIC_OR_DEPARTMENT_OTHER): Payer: Self-pay | Admitting: Emergency Medicine

## 2021-03-24 MED ORDER — AMLODIPINE BESY-BENAZEPRIL HCL 5-10 MG PO CAPS: 1.0000 | ORAL_CAPSULE | Freq: Every day | ORAL | 0 refills | Status: DC

## 2021-03-24 NOTE — Telephone Encounter (Signed)
Patient was prescribed amlodipine. Now stating needs script for amlodipine combo.

## 2023-01-16 DIAGNOSIS — Z20822 Contact with and (suspected) exposure to covid-19: Secondary | ICD-10-CM | POA: Diagnosis not present

## 2023-01-16 DIAGNOSIS — I10 Essential (primary) hypertension: Secondary | ICD-10-CM | POA: Diagnosis not present

## 2023-01-16 DIAGNOSIS — R109 Unspecified abdominal pain: Secondary | ICD-10-CM | POA: Diagnosis not present

## 2023-01-16 DIAGNOSIS — E785 Hyperlipidemia, unspecified: Secondary | ICD-10-CM | POA: Diagnosis not present

## 2023-01-16 DIAGNOSIS — E669 Obesity, unspecified: Secondary | ICD-10-CM | POA: Diagnosis not present

## 2023-01-16 DIAGNOSIS — R0789 Other chest pain: Secondary | ICD-10-CM | POA: Diagnosis not present

## 2023-01-16 DIAGNOSIS — F17210 Nicotine dependence, cigarettes, uncomplicated: Secondary | ICD-10-CM | POA: Diagnosis not present

## 2023-01-16 DIAGNOSIS — Z6838 Body mass index (BMI) 38.0-38.9, adult: Secondary | ICD-10-CM | POA: Diagnosis not present

## 2023-01-16 DIAGNOSIS — Z79899 Other long term (current) drug therapy: Secondary | ICD-10-CM | POA: Diagnosis not present

## 2023-01-16 DIAGNOSIS — R079 Chest pain, unspecified: Secondary | ICD-10-CM | POA: Diagnosis not present

## 2023-01-16 DIAGNOSIS — R0602 Shortness of breath: Secondary | ICD-10-CM | POA: Diagnosis not present

## 2023-02-07 ENCOUNTER — Encounter: Payer: Self-pay | Admitting: Internal Medicine

## 2023-02-07 ENCOUNTER — Ambulatory Visit: Payer: BC Managed Care – PPO | Admitting: Internal Medicine

## 2023-02-07 VITALS — BP 144/89 | HR 76 | Ht 71.0 in | Wt 284.6 lb

## 2023-02-07 DIAGNOSIS — I10 Essential (primary) hypertension: Secondary | ICD-10-CM | POA: Diagnosis not present

## 2023-02-07 DIAGNOSIS — G4733 Obstructive sleep apnea (adult) (pediatric): Secondary | ICD-10-CM

## 2023-02-07 DIAGNOSIS — E782 Mixed hyperlipidemia: Secondary | ICD-10-CM

## 2023-02-07 DIAGNOSIS — R079 Chest pain, unspecified: Secondary | ICD-10-CM | POA: Diagnosis not present

## 2023-02-07 MED ORDER — METOPROLOL TARTRATE 50 MG PO TABS: 50.0000 mg | ORAL_TABLET | Freq: Two times a day (BID) | ORAL | 0 refills | Status: DC

## 2023-02-07 NOTE — Progress Notes (Signed)
Primary Physician/Referring:  Patient, No Pcp Per  Patient ID: MINGO HOILMAN, male    DOB: Apr 22, 1986, 37 y.o.   MRN: [id number]  Chief Complaint  Patient presents with   New Patient (Initial Visit)   Chest Pain   HPI:    CASSELL DOWELL  is a 37 y.o. male with past medical history significant for HTN, HLD, and OSA who is here to establish care with cardiology due to chest pain. He states he feels like his heart is beating harder, not faster, and that he feels it in his neck and his face. The chest pain occurs randomly and is not associated with activity but it is centrally located and sometimes on the left side. Patient describes this as his stomach dropping in his chest like he is on a roller coaster. He does have pretty bad GERD which he just recently started treatment for and that is helping. Patient is complaint with his CPAP machine. Of note, his father did die of a massive MI in his early 43s so patient is worried something is happening with his heart. He denies shortness of breath, diaphoresis, syncope, orthopnea, edema, PND, claudication.   Past Medical History:  Diagnosis Date   Allergy    Back pain    Hypertension    Shingles    Past Surgical History:  Procedure Laterality Date   TONSILECTOMY/ADENOIDECTOMY WITH MYRINGOTOMY     TONSILLECTOMY     Family History  Problem Relation Age of Onset   Gallstones Mother    Hypertension Father    Heart disease Sister        heart mumur   Stroke Maternal Grandfather    Breast cancer Paternal Grandmother    Liver disease Paternal Grandfather     Social History   Tobacco Use   Smoking status: Every Day    Types: Cigarettes    Last attempt to quit: 12/04/2012    Years since quitting: 10.1   Smokeless tobacco: Never   Tobacco comments:    per patient's health survey form - other (vapor)  Substance Use Topics   Alcohol use: Yes    Alcohol/week: 0.0 standard drinks of alcohol    Comment: 2/month   Marital Status: Single  ROS   Review of Systems  Cardiovascular:  Positive for chest pain and palpitations.  Gastrointestinal:  Positive for heartburn.   Objective  Blood pressure (!) 144/89, pulse 76, height '5\' 11"'$  (1.803 m), weight 284 lb 9.6 oz (129.1 kg), SpO2 97 %. Body mass index is 39.69 kg/m.     02/07/2023    9:50 AM 02/07/2023    9:39 AM 03/23/2021    7:09 PM  Vitals with BMI  Height  '5\' 11"'$    Weight  284 lbs 10 oz   BMI  [id number]   Systolic [id number]  Diastolic 89 94 97  Pulse 76 82 67     Physical Exam Vitals reviewed.  HENT:     Head: Normocephalic and atraumatic.  Cardiovascular:     Rate and Rhythm: Normal rate and regular rhythm.     Pulses: Normal pulses.     Heart sounds: Normal heart sounds. No murmur heard. Pulmonary:     Effort: Pulmonary effort is normal.     Breath sounds: Normal breath sounds.  Abdominal:     General: Bowel sounds are normal.  Musculoskeletal:     Right lower leg: No edema.     Left lower leg: No edema.  Skin:  General: Skin is warm and dry.  Neurological:     Mental Status: He is alert.     Medications and allergies  No Known Allergies   Medication list after today's encounter   Current Outpatient Medications:    ALPRAZolam (XANAX) 0.5 MG tablet, Take 0.5 mg by mouth at bedtime as needed for anxiety., Disp: , Rfl:    amLODipine (NORVASC) 10 MG tablet, Take 10 mg by mouth daily., Disp: , Rfl:    losartan (COZAAR) 25 MG tablet, Take 25 mg by mouth daily., Disp: , Rfl:    metoprolol tartrate (LOPRESSOR) 50 MG tablet, Take 1 tablet (50 mg total) by mouth 2 (two) times daily for 7 days., Disp: 14 tablet, Rfl: 0   pantoprazole (PROTONIX) 40 MG tablet, Take 40 mg by mouth daily., Disp: , Rfl:    Vitamin D, Ergocalciferol, (DRISDOL) 1.25 MG (50000 UNIT) CAPS capsule, Take 50,000 Units by mouth every 7 (seven) days., Disp: , Rfl:   Laboratory examination:   Lab Results  Component Value Date   NA 139 03/23/2021   K 3.6 03/23/2021   CO2 26 03/23/2021    GLUCOSE 92 03/23/2021   BUN 12 03/23/2021   CREATININE 0.80 03/23/2021   CALCIUM 9.5 03/23/2021   GFRNONAA >60 03/23/2021       Latest Ref Rng & Units 03/23/2021    3:32 PM 05/23/2017   11:46 AM 05/13/2017   12:18 PM  CMP  Glucose 70 - 99 mg/dL 92  85  89   BUN 6 - 20 mg/dL '12  12  13   '$ Creatinine 0.61 - 1.24 mg/dL 0.80  0.83  0.96   Sodium 135 - 145 mmol/L 139  135  144   Potassium 3.5 - 5.1 mmol/L 3.6  4.0  4.5   Chloride 98 - 111 mmol/L 105  103  101   CO2 22 - 32 mmol/L '26  23  26   '$ Calcium 8.9 - 10.3 mg/dL 9.5  9.3  10.3   Total Protein 6.0 - 8.5 g/dL   7.6   Total Bilirubin 0.0 - 1.2 mg/dL   0.5   Alkaline Phos 39 - 117 IU/L   53   AST 0 - 40 IU/L   23   ALT 0 - 44 IU/L   24       Latest Ref Rng & Units 03/23/2021    3:04 PM 05/23/2017   11:46 AM 05/13/2017   12:18 PM  CBC  WBC 4.0 - 10.5 K/uL 11.0  10.3  8.1   Hemoglobin 13.0 - 17.0 g/dL 17.3  17.4  17.3   Hematocrit 39.0 - 52.0 % 49.9  49.3  50.5   Platelets 150 - 400 K/uL 233  260  316     Lipid Panel No results for input(s): "CHOL", "TRIG", "LDLCALC", "VLDL", "HDL", "CHOLHDL", "LDLDIRECT" in the last 8760 hours.  HEMOGLOBIN A1C Lab Results  Component Value Date   HGBA1C 5.1 05/13/2017   TSH No results for input(s): "TSH" in the last 8760 hours.  External labs:  01/16/2023: troponins negative x2 in ED Pro-BNP = 13 BUN 15 Cr 0.95 HGB 15.5 HCT 45.1 PLT 259   Radiology:    Cardiac Studies:     EKG:   02/07/2023: Sinus Rhythm, rate 76 bpm. Normal axis, no ischemia.  Assessment     ICD-10-CM   1. Chest pain of uncertain etiology  [id number] EKG 12-Lead    CT CARDIAC SCORING ([facility] LOCATIONS ONLY)  2. Essential hypertension  I10 CT CARDIAC SCORING ([facility] LOCATIONS ONLY)    3. Mixed hyperlipidemia  E78.2     4. OSA on CPAP  G47.33        Orders Placed This Encounter  Procedures   CT CARDIAC SCORING ([facility] LOCATIONS ONLY)    Standing Status:   Future    Standing Expiration Date:   04/08/2023    Order  Specific Question:   Preferred imaging location?    Answer:   [facility]   EKG 12-Lead    Meds ordered this encounter  Medications   metoprolol tartrate (LOPRESSOR) 50 MG tablet    Sig: Take 1 tablet (50 mg total) by mouth 2 (two) times daily for 7 days.    Dispense:  14 tablet    Refill:  0    Medications Discontinued During This Encounter  Medication Reason   amLODipine-benazepril (LOTREL) 5-10 MG capsule Entry Error   cyclobenzaprine (FLEXERIL) 10 MG tablet Completed Course   methylPREDNISolone (MEDROL DOSEPAK) 4 MG TBPK tablet Completed Course   gabapentin (NEURONTIN) 400 MG capsule Completed Course   cetirizine (ZYRTEC) 10 MG tablet Duplicate     Recommendations:   SHAWNDELL MALLERNEE is a 37 y.o.  male with HTN, HLD, and chest pain  Chest pain of uncertain etiology Atypical in nature and occurs at random times, not associated with activity Patient does have GERD and back pain which could be the cause He has significant family history of early heart disease, father died in his 33s from massive MI Will obtain CACS given risk factors (HTN, HLD, OSA, family hx)   Essential hypertension Continue current cardiac medications. Encourage low-sodium diet, less than 2000 mg daily. His BP is 120s/70s outside of the doctor's office Follow-up in 1-2 months or sooner if needed.   Mixed hyperlipidemia Currently not on statin, patient attempting to control lipids with diet and exercise If CACS elevated, we will discuss initiating statin therapy   OSA on CPAP Complaint with CPAP Follows with sleep medicine regularly     Floydene Flock, DO, [facility]  02/07/2023, 10:10 AM Office: [phone] Pager: [phone]

## 2023-02-08 ENCOUNTER — Telehealth: Payer: Self-pay

## 2023-02-08 NOTE — Telephone Encounter (Signed)
Patient's calcium score is scheduled for 02/18/23. He just wants to confirm that you wanted him to take the metoprolol for 7 days prior to procedure. [facility] is questing why he is to do this.

## 2023-03-21 ENCOUNTER — Ambulatory Visit: Payer: BC Managed Care – PPO | Admitting: Internal Medicine

## 2023-03-21 ENCOUNTER — Encounter: Payer: Self-pay | Admitting: Internal Medicine

## 2023-03-21 VITALS — BP 149/92 | HR 71 | Resp 14 | Ht 71.0 in | Wt 287.0 lb

## 2023-03-21 DIAGNOSIS — I10 Essential (primary) hypertension: Secondary | ICD-10-CM

## 2023-03-21 DIAGNOSIS — E782 Mixed hyperlipidemia: Secondary | ICD-10-CM

## 2023-03-21 DIAGNOSIS — G4733 Obstructive sleep apnea (adult) (pediatric): Secondary | ICD-10-CM

## 2023-03-21 DIAGNOSIS — R079 Chest pain, unspecified: Secondary | ICD-10-CM

## 2023-03-21 NOTE — Progress Notes (Signed)
Primary Physician/Referring:  Patient, No Pcp Per  Patient ID: Jobie QuakerJared F Barrett, male    DOB: 05/01/1986, 37 y.o.   MRN: [id number]  Chief Complaint  Patient presents with   Chest pain of uncertain etiology   Follow-up   HPI:    Jobie QuakerJared F Matlack  is a 37 y.o. male with past medical history significant for HTN, HLD, and OSA who is here for a follow-up visit. He had his coronary calcium score done but is unable to access his results. He has not had anymore chest pain. He believes it was heartburn because as soon as he stopped a medication that was exacerbating it, he did not have any further symptoms. Discussed with patient that if his CACS is very high we will initiate statin therapy and he is agreeable to this. He denies shortness of breath, diaphoresis, syncope, orthopnea, edema, PND, claudication.   Past Medical History:  Diagnosis Date   Allergy    Back pain    Hypertension    Shingles    Past Surgical History:  Procedure Laterality Date   TONSILECTOMY/ADENOIDECTOMY WITH MYRINGOTOMY     TONSILLECTOMY     Family History  Problem Relation Age of Onset   Gallstones Mother    Hypertension Father    Heart disease Sister        heart mumur   Stroke Maternal Grandfather    Breast cancer Paternal Grandmother    Liver disease Paternal Grandfather     Social History   Tobacco Use   Smoking status: Every Day    Types: Cigarettes    Last attempt to quit: 12/04/2012    Years since quitting: 10.2   Smokeless tobacco: Never   Tobacco comments:    per patient's health survey form - other (vapor)  Substance Use Topics   Alcohol use: Yes    Alcohol/week: 0.0 standard drinks of alcohol    Comment: 2/month   Marital Status: Single  ROS  Review of Systems  Cardiovascular:  Positive for palpitations. Negative for chest pain.  Gastrointestinal:  Negative for heartburn.   Objective  Blood pressure (!) 149/92, pulse 71, resp. rate 14, height 5\' 11"  (1.803 m), weight 287 lb (130.2 kg),  SpO2 98 %. Body mass index is 40.03 kg/m.     03/21/2023    9:48 AM 02/07/2023    9:50 AM 02/07/2023    9:39 AM  Vitals with BMI  Height 5\' 11"   5\' 11"   Weight 287 lbs  284 lbs 10 oz  BMI 40.05  39.71  Systolic 149 144 [id number]  Diastolic 92 89 94  Pulse 71 76 82     Physical Exam Vitals reviewed.  HENT:     Head: Normocephalic and atraumatic.  Cardiovascular:     Rate and Rhythm: Normal rate and regular rhythm.     Pulses: Normal pulses.     Heart sounds: Normal heart sounds. No murmur heard. Pulmonary:     Effort: Pulmonary effort is normal.     Breath sounds: Normal breath sounds.  Abdominal:     General: Bowel sounds are normal.  Musculoskeletal:     Right lower leg: No edema.     Left lower leg: No edema.  Skin:    General: Skin is warm and dry.  Neurological:     Mental Status: He is alert.     Medications and allergies  No Known Allergies   Medication list after today's encounter   Current Outpatient Medications:  ALPRAZolam (XANAX) 0.5 MG tablet, Take 0.5 mg by mouth at bedtime as needed for anxiety., Disp: , Rfl:    hydrochlorothiazide (HYDRODIURIL) 12.5 MG tablet, Take 12.5 mg by mouth daily., Disp: , Rfl:    losartan (COZAAR) 25 MG tablet, Take 25 mg by mouth daily., Disp: , Rfl:    pantoprazole (PROTONIX) 40 MG tablet, Take 40 mg by mouth daily., Disp: , Rfl:    potassium chloride (KLOR-CON M) 10 MEQ tablet, Take 10 mEq by mouth daily., Disp: , Rfl:    Vitamin D, Ergocalciferol, (DRISDOL) 1.25 MG (50000 UNIT) CAPS capsule, Take 50,000 Units by mouth every 7 (seven) days., Disp: , Rfl:   Laboratory examination:   Lab Results  Component Value Date   NA 139 03/23/2021   K 3.6 03/23/2021   CO2 26 03/23/2021   GLUCOSE 92 03/23/2021   BUN 12 03/23/2021   CREATININE 0.80 03/23/2021   CALCIUM 9.5 03/23/2021   GFRNONAA >60 03/23/2021       Latest Ref Rng & Units 03/23/2021    3:32 PM 05/23/2017   11:46 AM 05/13/2017   12:18 PM  CMP  Glucose 70 - 99 mg/dL  92  85  89   BUN 6 - 20 mg/dL 12  12  13    Creatinine 0.61 - 1.24 mg/dL 0.27  2.53  6.64   Sodium 135 - 145 mmol/L 139  135  144   Potassium 3.5 - 5.1 mmol/L 3.6  4.0  4.5   Chloride 98 - 111 mmol/L 105  103  101   CO2 22 - 32 mmol/L 26  23  26    Calcium 8.9 - 10.3 mg/dL 9.5  9.3  40.3   Total Protein 6.0 - 8.5 g/dL   7.6   Total Bilirubin 0.0 - 1.2 mg/dL   0.5   Alkaline Phos 39 - 117 IU/L   53   AST 0 - 40 IU/L   23   ALT 0 - 44 IU/L   24       Latest Ref Rng & Units 03/23/2021    3:04 PM 05/23/2017   11:46 AM 05/13/2017   12:18 PM  CBC  WBC 4.0 - 10.5 K/uL 11.0  10.3  8.1   Hemoglobin 13.0 - 17.0 g/dL 47.4  25.9  56.3   Hematocrit 39.0 - 52.0 % 49.9  49.3  50.5   Platelets 150 - 400 K/uL 233  260  316     Lipid Panel No results for input(s): "CHOL", "TRIG", "LDLCALC", "VLDL", "HDL", "CHOLHDL", "LDLDIRECT" in the last 8760 hours.  HEMOGLOBIN A1C Lab Results  Component Value Date   HGBA1C 5.1 05/13/2017   TSH No results for input(s): "TSH" in the last 8760 hours.  External labs:  01/16/2023: troponins negative x2 in ED Pro-BNP = 13 BUN 15 Cr 0.95 HGB 15.5 HCT 45.1 PLT 259   Radiology:    Cardiac Studies:   CACS = 0  EKG:   02/07/2023: Sinus Rhythm, rate 76 bpm. Normal axis, no ischemia.  Assessment     ICD-10-CM   1. OSA on CPAP  G47.33     2. Essential hypertension  I10     3. Mixed hyperlipidemia  E78.2     4. Chest pain of uncertain etiology  R07.9        No orders of the defined types were placed in this encounter.   No orders of the defined types were placed in this encounter.   Medications Discontinued During  This Encounter  Medication Reason   amLODipine (NORVASC) 10 MG tablet    metoprolol tartrate (LOPRESSOR) 50 MG tablet      Recommendations:   KAIDAN ESCAMILLA is a 37 y.o.  male with HTN, HLD  Chest pain of uncertain etiology No longer having chest pain, he thinks it was related to heartburn He has significant family history of  early heart disease, father died in his 4s from massive MI He had CACS done, score = 0   Essential hypertension Continue current cardiac medications. Encourage low-sodium diet, less than 2000 mg daily. His BP is 120s/70s outside of the doctor's office PCP currently adjusting his BP meds as he had leg swelling from amlodipine Follow-up in 6 months or sooner if needed.   Mixed hyperlipidemia Currently not on statin, patient attempting to control lipids with diet and exercise   OSA on CPAP Complaint with CPAP Follows with sleep medicine regularly     Clotilde Dieter, DO, [facility]  03/21/2023, 10:16 AM Office: [phone] Pager: [phone]

## 2023-03-22 ENCOUNTER — Telehealth: Payer: Self-pay

## 2023-03-22 NOTE — Telephone Encounter (Signed)
Calcium score was zero patient was given results

## 2023-03-22 NOTE — Telephone Encounter (Signed)
Lvm for patient to see when/where he has the coronary calcium score
